# Patient Record
Sex: Female | Born: 1990
Health system: Southern US, Community
[De-identification: ages and names within clinical notes are randomized; demographics above are authoritative.]

## PROBLEM LIST (undated history)

## (undated) DIAGNOSIS — K219 Gastro-esophageal reflux disease without esophagitis: Secondary | ICD-10-CM

## (undated) DIAGNOSIS — J189 Pneumonia, unspecified organism: Secondary | ICD-10-CM

## (undated) HISTORY — PX: WISDOM TOOTH EXTRACTION: SHX21

## (undated) HISTORY — DX: Gastro-esophageal reflux disease without esophagitis: K21.9

## (undated) HISTORY — PX: TONSILLECTOMY: SUR1361

---
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
## 2014-09-30 ENCOUNTER — Other Ambulatory Visit: Payer: Self-pay | Admitting: Family Medicine

## 2017-11-07 DIAGNOSIS — Z136 Encounter for screening for cardiovascular disorders: Secondary | ICD-10-CM | POA: Diagnosis not present

## 2017-11-07 DIAGNOSIS — E282 Polycystic ovarian syndrome: Secondary | ICD-10-CM | POA: Diagnosis not present

## 2017-12-27 ENCOUNTER — Ambulatory Visit: Payer: Self-pay | Admitting: Family Medicine

## 2018-01-17 DIAGNOSIS — R7989 Other specified abnormal findings of blood chemistry: Secondary | ICD-10-CM | POA: Diagnosis not present

## 2018-01-17 DIAGNOSIS — E282 Polycystic ovarian syndrome: Secondary | ICD-10-CM | POA: Diagnosis not present

## 2018-01-17 DIAGNOSIS — N926 Irregular menstruation, unspecified: Secondary | ICD-10-CM | POA: Diagnosis not present

## 2018-01-17 DIAGNOSIS — N912 Amenorrhea, unspecified: Secondary | ICD-10-CM | POA: Diagnosis not present

## 2018-01-17 DIAGNOSIS — D509 Iron deficiency anemia, unspecified: Secondary | ICD-10-CM | POA: Diagnosis not present

## 2018-01-17 DIAGNOSIS — R946 Abnormal results of thyroid function studies: Secondary | ICD-10-CM | POA: Diagnosis not present

## 2018-10-10 DIAGNOSIS — Z01419 Encounter for gynecological examination (general) (routine) without abnormal findings: Secondary | ICD-10-CM | POA: Diagnosis not present

## 2018-10-10 DIAGNOSIS — Z124 Encounter for screening for malignant neoplasm of cervix: Secondary | ICD-10-CM | POA: Diagnosis not present

## 2018-11-07 DIAGNOSIS — R87612 Low grade squamous intraepithelial lesion on cytologic smear of cervix (LGSIL): Secondary | ICD-10-CM | POA: Diagnosis not present

## 2018-11-07 DIAGNOSIS — N72 Inflammatory disease of cervix uteri: Secondary | ICD-10-CM | POA: Diagnosis not present

## 2018-11-07 DIAGNOSIS — N870 Mild cervical dysplasia: Secondary | ICD-10-CM | POA: Diagnosis not present

## 2018-11-13 DIAGNOSIS — Z20828 Contact with and (suspected) exposure to other viral communicable diseases: Secondary | ICD-10-CM | POA: Diagnosis not present

## 2018-11-19 DIAGNOSIS — H11151 Pinguecula, right eye: Secondary | ICD-10-CM | POA: Diagnosis not present

## 2019-01-14 DIAGNOSIS — R7989 Other specified abnormal findings of blood chemistry: Secondary | ICD-10-CM | POA: Diagnosis not present

## 2019-01-14 DIAGNOSIS — R1031 Right lower quadrant pain: Secondary | ICD-10-CM | POA: Diagnosis not present

## 2019-01-14 DIAGNOSIS — F17210 Nicotine dependence, cigarettes, uncomplicated: Secondary | ICD-10-CM | POA: Diagnosis not present

## 2019-01-16 DIAGNOSIS — R102 Pelvic and perineal pain: Secondary | ICD-10-CM | POA: Diagnosis not present

## 2019-01-16 DIAGNOSIS — E282 Polycystic ovarian syndrome: Secondary | ICD-10-CM | POA: Diagnosis not present

## 2019-02-11 ENCOUNTER — Encounter: Payer: BC Managed Care – PPO | Admitting: Obstetrics and Gynecology

## 2019-02-20 ENCOUNTER — Other Ambulatory Visit: Payer: Self-pay

## 2019-02-20 ENCOUNTER — Ambulatory Visit (INDEPENDENT_AMBULATORY_CARE_PROVIDER_SITE_OTHER): Payer: BC Managed Care – PPO | Admitting: Obstetrics and Gynecology

## 2019-02-20 ENCOUNTER — Encounter: Payer: Self-pay | Admitting: Obstetrics and Gynecology

## 2019-02-20 ENCOUNTER — Encounter: Payer: BC Managed Care – PPO | Admitting: Obstetrics and Gynecology

## 2019-02-20 VITALS — BP 134/87 | HR 108 | Ht 67.0 in | Wt 254.0 lb

## 2019-02-20 DIAGNOSIS — R1031 Right lower quadrant pain: Secondary | ICD-10-CM

## 2019-02-20 DIAGNOSIS — N83201 Unspecified ovarian cyst, right side: Secondary | ICD-10-CM | POA: Diagnosis not present

## 2019-02-20 DIAGNOSIS — R102 Pelvic and perineal pain: Secondary | ICD-10-CM

## 2019-02-20 NOTE — Progress Notes (Signed)
Gynecology Pelvic Pain Evaluation   Chief Complaint:  Chief Complaint  Patient presents with  . Ovarian Cyst    Ultrasound showed right side cyst Elzie Rings     History of Present Illness:   Patient is a 29 y.o. G0P0000 who LMP was Patient's last menstrual period was 02/19/2019., presents today for a problem visit.  She complains of pelvic pain.   Her pain is localized to the RLQ area, described as sharp and stabbing, began on January 4th, woke patient from sleep. and its severity is described as moderate. The pain radiates to the  Non-radiating. She has these associated symptoms which include abdominal pain. Patient has these modifiers which include relaxation and lying down that make it better and movement and activity that make it worse.  Context includes: unrelated to menstrual cycle.    Previous evaluation: ultrasound showing at outside gynecologist showing a a 4.8cm (records not available for review) Previous Treatment: NSAIDs and plan for follow up ultrasound.  The patient has not personal or family history of breast, ovarian or colon cancer.  Pap smears are up to date.    Review of Systems: Review of Systems  Constitutional: Negative.   Gastrointestinal: Positive for abdominal pain.  Genitourinary: Negative.     Past Medical History:  Past Medical History:  Diagnosis Date  . GERD (gastroesophageal reflux disease)     Past Surgical History:  Past Surgical History:  Procedure Laterality Date  . NO PAST SURGERIES      Gynecologic History:  Patient's last menstrual period was 02/19/2019.  Obstetric History: G0P0000  Family History:  History reviewed. No pertinent family history.  Social History:  Social History   Socioeconomic History  . Marital status: Single    Spouse name: Not on file  . Number of children: Not on file  . Years of education: Not on file  . Highest education level: Not on file  Occupational History  . Not on file  Tobacco Use  .  Smoking status: Current Every Day Smoker  . Smokeless tobacco: Never Used  Substance and Sexual Activity  . Alcohol use: Yes    Comment: Occ  . Drug use: Never  . Sexual activity: Yes    Birth control/protection: Pill  Other Topics Concern  . Not on file  Social History Narrative  . Not on file   Social Determinants of Health   Financial Resource Strain:   . Difficulty of Paying Living Expenses: Not on file  Food Insecurity:   . Worried About Charity fundraiser in the Last Year: Not on file  . Ran Out of Food in the Last Year: Not on file  Transportation Needs:   . Lack of Transportation (Medical): Not on file  . Lack of Transportation (Non-Medical): Not on file  Physical Activity:   . Days of Exercise per Week: Not on file  . Minutes of Exercise per Session: Not on file  Stress:   . Feeling of Stress : Not on file  Social Connections:   . Frequency of Communication with Friends and Family: Not on file  . Frequency of Social Gatherings with Friends and Family: Not on file  . Attends Religious Services: Not on file  . Active Member of Clubs or Organizations: Not on file  . Attends Archivist Meetings: Not on file  . Marital Status: Not on file  Intimate Partner Violence:   . Fear of Current or Ex-Partner: Not on file  . Emotionally  Abused: Not on file  . Physically Abused: Not on file  . Sexually Abused: Not on file    Allergies:  No Known Allergies  Medications: Prior to Admission medications   Medication Sig Start Date End Date Taking? Authorizing Provider  acyclovir (ZOVIRAX) 400 MG tablet Take 400 mg by mouth daily. 09/11/18  Yes [provider]  metFORMIN (GLUCOPHAGE) 1000 MG tablet Take by mouth.   Yes [provider]  [name] 3-0.02 MG tablet Take 1 tablet by mouth daily. 01/20/19  Yes [provider]  pantoprazole (PROTONIX) 40 MG tablet Take 40 mg by mouth daily.   Yes [provider]    Physical Exam Vitals:  Blood pressure 134/87, pulse (!) 108, height 5\' 7"  (1.702 m), weight 254 lb (115.2 kg), last menstrual period 02/19/2019.  General: NAD HEENT: normocephalic, anicteric Pulmonary: No increased work of breathing Genitourinary:  External: Normal external female genitalia.  Normal urethral meatus, normal Bartholin's and Skene's glands.    Vagina: Normal vaginal mucosa, no evidence of prolapse.    Cervix: Grossly normal in appearance, no bleeding  Uterus: Non-enlarged, mobile, normal contour.  No CMT  Adnexa: ovaries non-enlarged, some fullness appreciated over the right adnexa  Rectal: deferred  Lymphatic: no evidence of inguinal lymphadenopathy Extremities: no edema, erythema, or tenderness Neurologic: Grossly intact Psychiatric: mood appropriate, affect full  Female chaperone present for pelvic portion of the physical exam  Assessment: 29 y.o. G0P0000 with pelvic pain and previously imaged right ovarian cyst.  Problem List Items Addressed This Visit    None    Visit Diagnoses    Pelvic pain    -  Primary   Relevant Orders   US Transvaginal Non-OB   Right ovarian cyst       Relevant Orders   US Transvaginal Non-OB       1)The incidence and implication of adnexal masses and ovarian cysts were discussed with the patient in detail.  Prior imaging if available was reviewed at today's visit..  The vast majority of these lesions will represent benign or physiologic processes and may well resolve on repeat imaging with expectant management.  We discussed that in a premenopausal patient not on ovulation suppression with via a systemic form hormonal contraception the normal function of the ovary during follicular development is the formation of a dominant follicle or cyst(s) every month.  This is an essential part of normal reproductive physiology.  In some cases these cysts can take on larger dimensions, hemorrhage, or undergo torsion making them symptomatic. Torsion is relatively unlikely for  lesions under 5 cm.  Based on initial imaging findings the overall concern for malignancy is deemed low.  We will obtain follow up imagine approximately 6 weeks from the date of the initial imaging study.  Torsion precautions were given.    -  TVUS to evaluate up to date on pap per patient, request for outside records signed today at check out  2) A total of 15 minutes were spent in face-to-face contact with the patient during this encounter with over half of that time devoted to counseling and coordination of care.  3) Return in 5 days (on 02/25/2019) for  TUVS and follow up (release of medical records form prior US and pap).   [name], MD, [facility], [place]

## 2019-02-25 ENCOUNTER — Ambulatory Visit (INDEPENDENT_AMBULATORY_CARE_PROVIDER_SITE_OTHER): Payer: BC Managed Care – PPO | Admitting: Obstetrics and Gynecology

## 2019-02-25 ENCOUNTER — Other Ambulatory Visit: Payer: Self-pay

## 2019-02-25 ENCOUNTER — Other Ambulatory Visit: Payer: Self-pay | Admitting: Obstetrics and Gynecology

## 2019-02-25 ENCOUNTER — Ambulatory Visit (INDEPENDENT_AMBULATORY_CARE_PROVIDER_SITE_OTHER): Payer: BC Managed Care – PPO

## 2019-02-25 ENCOUNTER — Encounter: Payer: Self-pay | Admitting: Obstetrics and Gynecology

## 2019-02-25 VITALS — BP 146/80 | Wt 253.0 lb

## 2019-02-25 DIAGNOSIS — R1900 Intra-abdominal and pelvic swelling, mass and lump, unspecified site: Secondary | ICD-10-CM | POA: Diagnosis not present

## 2019-02-25 DIAGNOSIS — N83201 Unspecified ovarian cyst, right side: Secondary | ICD-10-CM

## 2019-02-25 DIAGNOSIS — R109 Unspecified abdominal pain: Secondary | ICD-10-CM

## 2019-02-25 DIAGNOSIS — R102 Pelvic and perineal pain: Secondary | ICD-10-CM | POA: Diagnosis not present

## 2019-02-25 NOTE — Progress Notes (Signed)
Gynecology Ultrasound Follow Up  Chief Complaint:  Chief Complaint  Patient presents with  . Follow-up    GYN ultrasound     History of Present Illness: Patient is a 29 y.o. female who presents today for ultrasound evaluation of previously imaged right ovarian mass.  Ultrasound demonstrates the following findgins Adnexa: Left ovary normal, the right ovary is not visualized.  There is a midline 11.5 cm complex pelvic mass in the posterior cul de sac that is though to represent the right ovary.  Uterus: Non-enlarged with endometrial stripe homogenous, without focal lesions Additional: no free fluid  Review of Systems: Review of Systems  Constitutional: Negative.   Gastrointestinal: Positive for abdominal pain.  Genitourinary: Negative.     Past Medical History:  Past Medical History:  Diagnosis Date  . GERD (gastroesophageal reflux disease)     Past Surgical History:  Past Surgical History:  Procedure Laterality Date  . NO PAST SURGERIES      Gynecologic History:  Patient's last menstrual period was 02/19/2019.  Family History:  History reviewed. No pertinent family history.  Social History:  Social History   Socioeconomic History  . Marital status: Single    Spouse name: Not on file  . Number of children: Not on file  . Years of education: Not on file  . Highest education level: Not on file  Occupational History  . Not on file  Tobacco Use  . Smoking status: Current Every Day Smoker  . Smokeless tobacco: Never Used  Substance and Sexual Activity  . Alcohol use: Yes    Comment: Occ  . Drug use: Never  . Sexual activity: Yes    Birth control/protection: Pill  Other Topics Concern  . Not on file  Social History Narrative  . Not on file   Social Determinants of Health   Financial Resource Strain:   . Difficulty of Paying Living Expenses: Not on file  Food Insecurity:   . Worried About Charity fundraiser in the Last Year: Not on file  . Ran Out  of Food in the Last Year: Not on file  Transportation Needs:   . Lack of Transportation (Medical): Not on file  . Lack of Transportation (Non-Medical): Not on file  Physical Activity:   . Days of Exercise per Week: Not on file  . Minutes of Exercise per Session: Not on file  Stress:   . Feeling of Stress : Not on file  Social Connections:   . Frequency of Communication with Friends and Family: Not on file  . Frequency of Social Gatherings with Friends and Family: Not on file  . Attends Religious Services: Not on file  . Active Member of Clubs or Organizations: Not on file  . Attends Archivist Meetings: Not on file  . Marital Status: Not on file  Intimate Partner Violence:   . Fear of Current or Ex-Partner: Not on file  . Emotionally Abused: Not on file  . Physically Abused: Not on file  . Sexually Abused: Not on file    Allergies:  No Known Allergies  Medications: Prior to Admission medications   Medication Sig Start Date End Date Taking? Authorizing Provider  acyclovir (ZOVIRAX) 400 MG tablet Take 400 mg by mouth daily. 09/11/18   [provider]  metFORMIN (GLUCOPHAGE) 1000 MG tablet Take by mouth.    [provider]  [name] 3-0.02 MG tablet Take 1 tablet by mouth daily. 01/20/19   [provider]  pantoprazole (  PROTONIX) 40 MG tablet Take 40 mg by mouth daily.    [provider]    Physical Exam Vitals: Blood pressure (!) 146/80, weight 253 lb (114.8 kg), last menstrual period 02/19/2019.  General: NAD HEENT: normocephalic, anicteric Pulmonary: No increased work of breathing Extremities: no edema, erythema, or tenderness Neurologic: Grossly intact, normal gait Psychiatric: mood appropriate, affect full  US PELVIC COMPLETE WITH TRANSVAGINAL  Result Date: 02/25/2019 Patient Name: Stephanie Price DOB: [birth year] MRN: [id number] ULTRASOUND REPORT Location: [facility] Date of Service: 02/25/2019 Indications:Pelvic Pain and right  ovarian cyst Findings: The uterus is axial and measures 6.7 x 4.7 x 3.4 cm. Echo texture is homogenous without evidence of focal masses. The Endometrium measures 8.3 mm. There is a complex mass in the midline pelvis that may be within the right ovary. It measures 11.5 x 8.4 x 10.7 cm. No blood flow is seen within. This may represent a dermoid cyst vs. Other. Left Ovary measures 3.4 x 2.4 x 2.3 cm. It is normal in appearance. Survey of the adnexa demonstrates no adnexal masses. There is no free fluid in the cul de sac. Impression: 1. Normal appearing uterus and left ovary. 2. There is a 11.5 cm complex mass in the midline pelvis that may be from the right ovary. Recommendations: 1.Clinical correlation with the patient's History and Physical Exam. Gweneth Dimitri, RT Patient presents for follow up imaging for 4.8cm right ovarian mass noted on outside imaging.  Records were requested but still not available for review.  Imaging today shows a 11.5 x 8.4 x 10.7cm mass in the posterior cul de sac.  The right ovary is not visualized so it is presumed this mass is arising from the right ovary.  The appearance is complex with acoustic shadowing.  Given patient age dermoid is the highest on the differential.  However, the interval increase in size from initial imaging warrants further work up prior to removal. [name], MD, [place] 02/25/2019, 10:55 AM    Assessment: 29 y.o. G0P0000 No problem-specific Assessment & Plan notes found for this encounter.   Plan: Problem List Items Addressed This Visit    None    Visit Diagnoses    Pelvic mass    -  Primary   Relevant Orders   Ovarian Malignancy Risk-ROMA   MR PELVIS W WO CONTRAST      1)  Pelvic mass - I still not have prior imaging to review.  However, per patient she reports that she was told the right ovary has a 4.8cm cyst.  Given complex appearance and interval increase in size a ROMA was ordered as well  as MRI to further characterize.  Differential includes both benign and malignant ovarian neoplasms.  Given age and appearance highest suspicion is for a Dermoid ovarian cyst.  The mass has limited blood flow on it internal component.  An ovary that has already undergone torsion is also in the differential.  If MRI and ROMA favor a benign process will proceed with laparoscopic resection, if concern for malignancy will arrange joint case with gynecology oncology.    2) A total of 15 minutes were spent in face-to-face contact with the patient during this encounter with over half of that time devoted to counseling and coordination of care.  3) Return if symptoms worsen or fail to improve.   [name], MD, [facility], [facility] 02/25/2019, 10:44 AM

## 2019-02-26 ENCOUNTER — Telehealth: Payer: Self-pay | Admitting: Obstetrics and Gynecology

## 2019-02-26 LAB — POSTMENOPAUSAL INTERP: LOW

## 2019-02-26 LAB — OVARIAN MALIGNANCY RISK-ROMA
Cancer Antigen (CA) 125: 21.6 U/mL (ref 0.0–38.1)
HE4: 41.4 pmol/L (ref 0.0–61.2)
Postmenopausal ROMA: 1.23
Premenopausal ROMA: 0.5

## 2019-02-26 LAB — PREMENOPAUSAL INTERP: LOW

## 2019-02-26 NOTE — Telephone Encounter (Signed)
Spoke with pt and confirmed 2/25 surgery date Informed her of H&P with [facility] on 2/23 @ 8:10 (req. she arrive @ 8:00) Told pt to have Covid testing done at [place] after H&P appt on 2/23.  Confirmed her MRI appt 2/24 @ 4:00 and advised to wear mask.  Pt had questions regarding artifical nails prior to surgery.  Pt asked for recent lab results, informed that nurse would return call. Pt anxious for results.

## 2019-02-27 NOTE — Telephone Encounter (Signed)
Pt calling for blood marker results.  [phone] Pt aware AMS is out of the office today but msg will be forwarded to him.

## 2019-02-28 NOTE — Telephone Encounter (Signed)
Patient returning call, aware of normal results, no questions.

## 2019-02-28 NOTE — Telephone Encounter (Signed)
Tried contacting patient the results are normal, her voicemailbox is not set up

## 2019-03-04 ENCOUNTER — Other Ambulatory Visit: Payer: Self-pay

## 2019-03-04 ENCOUNTER — Encounter
Admission: RE | Admit: 2019-03-04 | Discharge: 2019-03-04 | Disposition: A | Discharge status: Home / Self Care | Payer: BC Managed Care – PPO | Source: Ambulatory Visit | Attending: Obstetrics and Gynecology | Admitting: Obstetrics and Gynecology

## 2019-03-04 NOTE — Patient Instructions (Signed)
Your procedure is scheduled on: 03/07/19 Report to [place]. To find out your arrival time please call [phone] between 1PM - 3PM on 03/06/19.  Remember: Instructions that are not followed completely may result in serious medical risk, up to and including death, or upon the discretion of your surgeon and anesthesiologist your surgery may need to be rescheduled.     _X__ 1. Do not eat food after midnight the night before your procedure.                 No gum chewing or hard candies. You may drink clear liquids up to 2 hours                 before you are scheduled to arrive for your surgery- DO not drink clear                 liquids within 2 hours of the start of your surgery.                 Clear Liquids include:  water, apple juice without pulp, clear carbohydrate                 drink such as Clearfast or Gatorade, Black Coffee or Tea (Do not add                 anything to coffee or tea). Diabetics water only  __X__2.  On the morning of surgery brush your teeth with toothpaste and water, you                 may rinse your mouth with mouthwash if you wish.  Do not swallow any              toothpaste of mouthwash.     _X__ 3.  No Alcohol for 24 hours before or after surgery.   _X__ 4.  Do Not Smoke or use e-cigarettes For 24 Hours Prior to Your Surgery.                 Do not use any chewable tobacco products for at least 6 hours prior to                 surgery.  ____  5.  Bring all medications with you on the day of surgery if instructed.   __X__  6.  Notify your doctor if there is any change in your medical condition      (cold, fever, infections).     Do not wear jewelry, make-up, hairpins, clips or nail polish. Do not wear lotions, powders, or perfumes.  Do not shave 48 hours prior to surgery. Men may shave face and neck. Do not bring valuables to the hospital.    [facility] is not responsible for any belongings or  valuables.  Contacts, dentures/partials or body piercings may not be worn into surgery. Bring a case for your contacts, glasses or hearing aids, a denture cup will be supplied. Leave your suitcase in the car. After surgery it may be brought to your room. For patients admitted to the hospital, discharge time is determined by your treatment team.   Patients discharged the day of surgery will not be allowed to drive home.   Please read over the following fact sheets that you were given:   MRSA Information  __X__ Take these medicines the morning of surgery with A SIP OF WATER:  1. pantoprazole (PROTONIX) 40 MG tablet  2. Stephanie Price 3-0.02 MG tablet  3. acyclovir (ZOVIRAX) 400 MG tablet if needed  4.  5.  6.  ____ Fleet Enema (as directed)   __X__ Use CHG Soap/SAGE wipes as directed  ____ Use inhalers on the day of surgery  ____ Stop metformin/Janumet/Farxiga 2 days prior to surgery    ____ Take 1/2 of usual insulin dose the night before surgery. No insulin the morning          of surgery.   ____ Stop Blood Thinners Coumadin/Plavix/Xarelto/Pleta/Pradaxa/Eliquis/Effient/Aspirin  on   Or contact your Surgeon, Cardiologist or Medical Doctor regarding  ability to stop your blood thinners  __X__ Stop Anti-inflammatories 7 days before surgery such as Advil, Ibuprofen, Motrin,  BC or Goodies Powder, Naprosyn, Naproxen, Aleve, Aspirin    __X__ Stop all herbal supplements, fish oil or vitamin E until after surgery.    ____ Bring C-Pap to the hospital.

## 2019-03-05 ENCOUNTER — Other Ambulatory Visit
Admission: RE | Admit: 2019-03-05 | Discharge: 2019-03-05 | Disposition: A | Discharge status: Home / Self Care | Payer: BC Managed Care – PPO | Source: Ambulatory Visit | Attending: Obstetrics and Gynecology | Admitting: Obstetrics and Gynecology

## 2019-03-05 ENCOUNTER — Encounter: Payer: Self-pay | Admitting: Obstetrics and Gynecology

## 2019-03-05 ENCOUNTER — Ambulatory Visit (INDEPENDENT_AMBULATORY_CARE_PROVIDER_SITE_OTHER): Payer: BC Managed Care – PPO | Admitting: Obstetrics and Gynecology

## 2019-03-05 VITALS — BP 126/80 | HR 73 | Ht 67.0 in | Wt 254.0 lb

## 2019-03-05 DIAGNOSIS — R1900 Intra-abdominal and pelvic swelling, mass and lump, unspecified site: Secondary | ICD-10-CM

## 2019-03-05 DIAGNOSIS — Z01812 Encounter for preprocedural laboratory examination: Secondary | ICD-10-CM | POA: Insufficient documentation

## 2019-03-05 DIAGNOSIS — Z01818 Encounter for other preprocedural examination: Secondary | ICD-10-CM | POA: Diagnosis not present

## 2019-03-05 DIAGNOSIS — U071 COVID-19: Secondary | ICD-10-CM | POA: Insufficient documentation

## 2019-03-05 DIAGNOSIS — R1909 Other intra-abdominal and pelvic swelling, mass and lump: Secondary | ICD-10-CM

## 2019-03-05 LAB — SARS CORONAVIRUS 2 (TAT 6-24 HRS): SARS Coronavirus 2: POSITIVE — AB

## 2019-03-05 NOTE — Progress Notes (Signed)
Obstetrics & Gynecology Surgery H&P    Chief Complaint: Scheduled Surgery   History of Present Illness: Patient is a 29 y.o. G0P0000 presenting for scheduled laparoscopic removal of pelvic mass, for the treatment or further evaluation of pelvic pain with 10cm pelvic mass on ultrasound.   Prior Treatments prior to proceeding with surgery include: imaging  Preoperative Pap: UTD per patient at [facility] Preoperative Endometrial biopsy: N/A Preoperative Ultrasound: 02/25/2019 10cm central pelvic mass, complex in appearance.  Negative ROMA tumor markers 02/25/2019.  MRI 03/06/2019 pending   Review of Systems:10 point review of systems  Past Medical History:  Past Medical History:  Diagnosis Date  . GERD (gastroesophageal reflux disease)     Past Surgical History:  Past Surgical History:  Procedure Laterality Date  . TONSILLECTOMY      Family History:  History reviewed. No pertinent family history.  Social History:  Social History   Socioeconomic History  . Marital status: Single    Spouse name: Not on file  . Number of children: Not on file  . Years of education: Not on file  . Highest education level: Not on file  Occupational History  . Not on file  Tobacco Use  . Smoking status: Current Every Day Smoker    Packs/day: 0.50    Types: Cigarettes  . Smokeless tobacco: Never Used  Substance and Sexual Activity  . Alcohol use: Yes    Comment: Occ  . Drug use: Never  . Sexual activity: Yes    Birth control/protection: Pill  Other Topics Concern  . Not on file  Social History Narrative  . Not on file   Social Determinants of Health   Financial Resource Strain:   . Difficulty of Paying Living Expenses: Not on file  Food Insecurity:   . Worried About Charity fundraiser in the Last Year: Not on file  . Ran Out of Food in the Last Year: Not on file  Transportation Needs:   . Lack of Transportation (Medical): Not on file  . Lack of Transportation  (Non-Medical): Not on file  Physical Activity:   . Days of Exercise per Week: Not on file  . Minutes of Exercise per Session: Not on file  Stress:   . Feeling of Stress : Not on file  Social Connections:   . Frequency of Communication with Friends and Family: Not on file  . Frequency of Social Gatherings with Friends and Family: Not on file  . Attends Religious Services: Not on file  . Active Member of Clubs or Organizations: Not on file  . Attends Archivist Meetings: Not on file  . Marital Status: Not on file  Intimate Partner Violence:   . Fear of Current or Ex-Partner: Not on file  . Emotionally Abused: Not on file  . Physically Abused: Not on file  . Sexually Abused: Not on file    Allergies:  No Known Allergies  Medications: Prior to Admission medications   Medication Sig Start Date End Date Taking? Authorizing Provider  acetaminophen (TYLENOL) 325 MG tablet Take 650 mg by mouth every 6 (six) hours as needed for moderate pain or headache.   Yes [provider]  acyclovir (ZOVIRAX) 400 MG tablet Take 400 mg by mouth daily as needed (outbreaks).  09/11/18  Yes [provider]  [name] 3-0.02 MG tablet Take 1 tablet by mouth daily. 01/20/19  Yes [provider]  pantoprazole (PROTONIX) 40 MG tablet Take 40 mg by mouth daily as  needed (GERD).    Yes [provider]    Physical Exam Vitals: Blood pressure 126/80, pulse 73, height 5\' 7"  (1.702 m), weight 254 lb (115.2 kg), last menstrual period 02/19/2019. General: NAD HEENT: normocephalic, anicteric Pulmonary: No increased work of breathing, CTAB Cardiovascular: RRR, distal pulses 2+ Abdomen: soft, non-tender, non-distended Extremities: no edema, erythema, or tenderness Neurologic: Grossly intact Psychiatric: mood appropriate, affect full  Imaging US PELVIC COMPLETE WITH TRANSVAGINAL  Result Date: 02/25/2019 Patient Name: ALLYANA FORSHAW DOB: 1990/08/23 MRN: [id number] ULTRASOUND  REPORT Location: [facility] Date of Service: 02/25/2019 Indications:Pelvic Pain and right ovarian cyst Findings: The uterus is axial and measures 6.7 x 4.7 x 3.4 cm. Echo texture is homogenous without evidence of focal masses. The Endometrium measures 8.3 mm. There is a complex mass in the midline pelvis that may be within the right ovary. It measures 11.5 x 8.4 x 10.7 cm. No blood flow is seen within. This may represent a dermoid cyst vs. Other. Left Ovary measures 3.4 x 2.4 x 2.3 cm. It is normal in appearance. Survey of the adnexa demonstrates no adnexal masses. There is no free fluid in the cul de sac. Impression: 1. Normal appearing uterus and left ovary. 2. There is a 11.5 cm complex mass in the midline pelvis that may be from the right ovary. Recommendations: 1.Clinical correlation with the patient's History and Physical Exam. Gweneth Dimitri, RT Patient presents for follow up imaging for 4.8cm right ovarian mass noted on outside imaging.  Records were requested but still not available for review.  Imaging today shows a 11.5 x 8.4 x 10.7cm mass in the posterior cul de sac.  The right ovary is not visualized so it is presumed this mass is arising from the right ovary.  The appearance is complex with acoustic shadowing.  Given patient age dermoid is the highest on the differential.  However, the interval increase in size from initial imaging warrants further work up prior to removal. [name], MD, [place] 02/25/2019, 10:55 AM   Assessment: 29 y.o. G0P0000 presenting for scheduled laparoscopic removal of pelvic mass  Plan: 1) I have had a careful discussion with this patient about all the options available and the risk/benefits of each. I have fully informed this patient that a laparoscopy may subject her to a variety of discomforts and risks: She understands that most patients have surgery with little difficulty, but problems can happen ranging from  minor to fatal. These include nausea, vomiting, pain, bleeding, infection, poor healing, hernia, or formation of adhesions. Unexpected reactions may occur from any drug or anesthetic given. Unintended injury may occur to other pelvic or abdominal structures such as Fallopian tubes, ovaries, bladder, ureter (tube from kidney to bladder), or bowel. Nerves going from the pelvis to the legs may be injured. Any such injury may require immediate or later additional surgery to correct the problem. Excessive blood loss requiring transfusion is very unlikely but possible. Dangerous blood clots may form in the legs or lungs. Physical and sexual activity will be restricted in varying degrees for an indeterminate period of time but most often 2-4 weeks. She understands that the plan is to do this laparoscopically, however, there is a chance that this will need to be performed via a larger incision. Finally, she understands that it is impossible to list every possible undesirable effect and that the condition for which surgery is done is not always cured or significantly improved, and in rare  cases may be even worsen. Ample time was given to answer all questions. - depending on origin of mass which is presumed to be right ovary, and depending on whether there is any normal ovarian tissue able to be identified the procedure may include oophorectomy, or salpingectomy  2) Routine postoperative instructions were reviewed with the patient and her family in detail today including the expected length of recovery and likely postoperative course.  The patient concurred with the proposed plan, giving informed written consent for the surgery today.  Patient instructed on the importance of being NPO after midnight prior to her procedure.  If warranted preoperative prophylactic antibiotics and SCDs ordered on call to the OR to meet SCIP guidelines and adhere to recommendation laid forth in [place] Number 104 May 2009   "Antibiotic Prophylaxis for Gynecologic Procedures".     [name], MD, [facility], [facility] 03/05/2019, 8:56 AM

## 2019-03-05 NOTE — Pre-Procedure Instructions (Addendum)
Secure chat with Dr Georgianne Fick:  Me: Pt had CBC and BMP done 01/14/19 at [facility] (care everywhere). Do you still want CBC. TY   Staebler: no we can use that as baseline      Copied from [facility] 01/14/19   CBC AND DIFFERENTIAL - Abnormal  Result Value  WBC 12.7 (*)  RBC 5.16 (*)  HGB 15.1 (*)  HCT 45.1  MCV 87  MCH 29.3  MCHC 33.5  Plt Ct 283  RDW SD 38.4  MPV 10.8  NRBC% 0.0  NRBC 0.000  NEUTROPHIL % 65.9  LYMPHOCYTE % 24.2 (*)  MONOCYTE % 6.4  Eosinophil % 2.3  BASOPHIL % 0.7  IG% 0.500 (*)  ABSOLUTE NEUTROPHIL COUNT 8.34 (*)  ABSOLUTE LYMPHOCYTE COUNT 3.1  MONO ABSOLUTE 0.8  EOS ABSOLUTE 0.3  BASO ABSOLUTE 0.1  IG ABSOLUTE 0.060 (*)  COMPREHENSIVE METABOLIC PANEL - Abnormal  Na 138  Potassium 4.0  Cl 104  CO2 23  Glucose 105 (*)  BUN 11  Creatinine 0.87  Ca 9.2  ALK PHOS 66  T Bili 0.25  Total Protein 7.1  Alb 4.0  GLOBULIN 3.1  ALBUMIN/GLOBULIN RATIO 1.3  BUN/CREAT RATIO 12.6  ALT 32  AST 20  GFR AFRICAN AMERICAN 104  Comment: African-American:  Normal GFR (glomerular filtration rate) > 60 mL/min/1.73 meters squared. < 60 may include impaired kidney function based on creatinine, age, legal sex, and race normalized to accepted average body surface area  GFR Non African American 90  Comment: Non African American:  Normal GFR (glomerular filtration rate) > 60 mL/min/1.73 meters squared. < 60 may include impaired kidney function based on creatinine, age, legal sex, and race normalized to accepted average body surface area.  AGAP 11  UA NEGATIVE POPULATION, POS SYMPTOMS - Abnormal  Urine Color Yellow  Urine Appearance Cloudy (*)  Urine Specific Gravity 1.028  Urine pH 5.5  Urine Protein - Dipstick Negative  Urine Glucose Negative  Urine Ketones Negative  Urine Bilirubin Negative  Urine Blood 0.1 (*)  Urine Nitrite Negative  Urine Urobilinogen <2  Urine Leukocyte Esterase Negative  Urine Squamous Epithelial Cells 10-20 (*)  Urine WBC  3-5 (*)  Urine RBC 0-2  Urine Bacteria 2+ (*)  Urine Mucous 2+ (*)  Urine Hyaline Casts 0 -1  Narrative:  Does not meet criteria for reflex to Urine Culture.  HCG, URINE, QUALITATIVE - Normal  U BETA HCG QUAL Negative  LIGHT BLUE TOP  GOLD SST

## 2019-03-06 ENCOUNTER — Telehealth: Payer: Self-pay | Admitting: Obstetrics and Gynecology

## 2019-03-06 ENCOUNTER — Ambulatory Visit: Payer: BC Managed Care – PPO

## 2019-03-06 ENCOUNTER — Telehealth: Payer: Self-pay | Admitting: Nurse Practitioner

## 2019-03-06 NOTE — Telephone Encounter (Signed)
Patient is calling back to speak with Dr. Georgianne Fick. Patient has additional questions she would like to speak to him about. Please advise

## 2019-03-06 NOTE — Telephone Encounter (Signed)
No stay overnight and the MRI will have/has been moved given the fact that they can't have the MRI down for any length of time following a scan on COVID positive patient

## 2019-03-06 NOTE — Telephone Encounter (Signed)
Called pt to adv that her DOS has been changed to 3/11 as a result of POS Covid test result  Adv that MRI was also changed to 3/8 @ 3:00, pt to arrive at 2:30  Per Diggins @ pre-admit, a 2nd phone interview and 2nd pre-admit Covid test are not needed.

## 2019-03-06 NOTE — Telephone Encounter (Signed)
I spoke to the patient, she states her COVID test came back +. You had told her things depend on the MRI. She is wanting to speak to you on how emergent is the MRI. What are odds of her staying overnight? She lives in [place] and needs to make plans. She has no S&S of COVID. Also, She doesn't think she can wait 3 more weeks. She is really adamant about speaking to you.

## 2019-03-06 NOTE — Telephone Encounter (Signed)
Called to discuss with Stephanie Price about Covid symptoms and the use of bamlanivimab, a monoclonal antibody infusion for those with mild to moderate Covid symptoms and at a high risk of hospitalization.    BMI is >35 however on chart review patient does not qualify for infusion therapy as she has asymptomatic infection per chart review and notes from 03/06/19 ([place], [place]) .   Unable to reach patient.   [name], AGPCNP-BC Pager: [phone] Amion: Bjorn Pippin

## 2019-03-18 ENCOUNTER — Ambulatory Visit
Admission: RE | Admit: 2019-03-18 | Discharge: 2019-03-18 | Disposition: A | Discharge status: Home / Self Care | Payer: BC Managed Care – PPO | Source: Ambulatory Visit | Attending: Obstetrics and Gynecology | Admitting: Obstetrics and Gynecology

## 2019-03-18 ENCOUNTER — Other Ambulatory Visit: Payer: Self-pay

## 2019-03-18 DIAGNOSIS — D270 Benign neoplasm of right ovary: Secondary | ICD-10-CM | POA: Diagnosis not present

## 2019-03-18 DIAGNOSIS — R1900 Intra-abdominal and pelvic swelling, mass and lump, unspecified site: Secondary | ICD-10-CM | POA: Insufficient documentation

## 2019-03-18 MED ORDER — GADOBUTROL 1 MMOL/ML IV SOLN
10.0000 mL | Freq: Once | INTRAVENOUS | Status: AC | PRN
  Administered 2019-03-18: 10 mL via INTRAVENOUS

## 2019-03-21 ENCOUNTER — Other Ambulatory Visit: Payer: Self-pay

## 2019-03-21 ENCOUNTER — Ambulatory Visit: Payer: BC Managed Care – PPO | Admitting: Anesthesiology

## 2019-03-21 ENCOUNTER — Encounter: Payer: Self-pay | Admitting: Obstetrics and Gynecology

## 2019-03-21 ENCOUNTER — Ambulatory Visit
Admission: RE | Admit: 2019-03-21 | Discharge: 2019-03-21 | Disposition: A | Discharge status: Home / Self Care | Payer: BC Managed Care – PPO | Source: Ambulatory Visit | Attending: Obstetrics and Gynecology | Admitting: Obstetrics and Gynecology

## 2019-03-21 ENCOUNTER — Encounter
Admission: RE | Disposition: A | Discharge status: Home / Self Care | Payer: Self-pay | Source: Ambulatory Visit | Attending: Obstetrics and Gynecology

## 2019-03-21 DIAGNOSIS — K219 Gastro-esophageal reflux disease without esophagitis: Secondary | ICD-10-CM | POA: Insufficient documentation

## 2019-03-21 DIAGNOSIS — R1900 Intra-abdominal and pelvic swelling, mass and lump, unspecified site: Secondary | ICD-10-CM | POA: Diagnosis not present

## 2019-03-21 DIAGNOSIS — N83291 Other ovarian cyst, right side: Secondary | ICD-10-CM

## 2019-03-21 DIAGNOSIS — F17210 Nicotine dependence, cigarettes, uncomplicated: Secondary | ICD-10-CM | POA: Diagnosis not present

## 2019-03-21 DIAGNOSIS — D270 Benign neoplasm of right ovary: Secondary | ICD-10-CM | POA: Insufficient documentation

## 2019-03-21 DIAGNOSIS — Z79899 Other long term (current) drug therapy: Secondary | ICD-10-CM | POA: Diagnosis not present

## 2019-03-21 DIAGNOSIS — Z9889 Other specified postprocedural states: Secondary | ICD-10-CM

## 2019-03-21 DIAGNOSIS — N83511 Torsion of right ovary and ovarian pedicle: Secondary | ICD-10-CM

## 2019-03-21 HISTORY — PX: LAPAROSCOPIC OVARIAN CYSTECTOMY: SHX6248

## 2019-03-21 LAB — POCT PREGNANCY, URINE: Preg Test, Ur: NEGATIVE

## 2019-03-21 LAB — TYPE AND SCREEN
ABO/RH(D): O POS
Antibody Screen: NEGATIVE

## 2019-03-21 LAB — ABO/RH: ABO/RH(D): O POS

## 2019-03-21 SURGERY — EXCISION, CYST, OVARY, LAPAROSCOPIC
Anesthesia: General

## 2019-03-21 MED ORDER — BUPIVACAINE HCL 0.5 % IJ SOLN
INTRAMUSCULAR | Status: DC | PRN
  Administered 2019-03-21: 15 mL

## 2019-03-21 MED ORDER — ACETAMINOPHEN NICU IV SYRINGE 10 MG/ML
INTRAVENOUS | Status: AC
  Filled 2019-03-21: qty 1

## 2019-03-21 MED ORDER — OXYCODONE HCL 5 MG PO TABS
5.0000 mg | ORAL_TABLET | Freq: Once | ORAL | Status: AC
  Administered 2019-03-21: 5 mg via ORAL

## 2019-03-21 MED ORDER — FAMOTIDINE 20 MG PO TABS: 20.0000 mg | ORAL_TABLET | Freq: Once | ORAL | Status: DC

## 2019-03-21 MED ORDER — SUGAMMADEX SODIUM 200 MG/2ML IV SOLN
INTRAVENOUS | Status: DC | PRN
  Administered 2019-03-21: 200 mg via INTRAVENOUS

## 2019-03-21 MED ORDER — SUCCINYLCHOLINE CHLORIDE 20 MG/ML IJ SOLN
INTRAMUSCULAR | Status: DC | PRN
  Administered 2019-03-21: 100 mg via INTRAVENOUS

## 2019-03-21 MED ORDER — PROPOFOL 500 MG/50ML IV EMUL
INTRAVENOUS | Status: AC
  Filled 2019-03-21: qty 50

## 2019-03-21 MED ORDER — OXYCODONE HCL 5 MG PO TABS
ORAL_TABLET | ORAL | Status: AC
  Filled 2019-03-21: qty 1

## 2019-03-21 MED ORDER — ROCURONIUM BROMIDE 100 MG/10ML IV SOLN
INTRAVENOUS | Status: DC | PRN
  Administered 2019-03-21: 10 mg via INTRAVENOUS
  Administered 2019-03-21: 20 mg via INTRAVENOUS

## 2019-03-21 MED ORDER — DEXMEDETOMIDINE HCL 200 MCG/2ML IV SOLN
INTRAVENOUS | Status: DC | PRN
  Administered 2019-03-21 (×2): 16 ug via INTRAVENOUS

## 2019-03-21 MED ORDER — BUPIVACAINE HCL (PF) 0.5 % IJ SOLN
INTRAMUSCULAR | Status: AC
  Filled 2019-03-21: qty 30

## 2019-03-21 MED ORDER — LACTATED RINGERS IV SOLN: INTRAVENOUS | Status: DC

## 2019-03-21 MED ORDER — ONDANSETRON HCL 4 MG/2ML IJ SOLN
INTRAMUSCULAR | Status: DC | PRN
  Administered 2019-03-21: 4 mg via INTRAVENOUS

## 2019-03-21 MED ORDER — PROPOFOL 10 MG/ML IV BOLUS
INTRAVENOUS | Status: DC | PRN
  Administered 2019-03-21: 160 mg via INTRAVENOUS

## 2019-03-21 MED ORDER — MIDAZOLAM HCL 2 MG/2ML IJ SOLN
INTRAMUSCULAR | Status: AC
  Filled 2019-03-21: qty 2

## 2019-03-21 MED ORDER — FENTANYL CITRATE (PF) 250 MCG/5ML IJ SOLN
INTRAMUSCULAR | Status: AC
  Filled 2019-03-21: qty 5

## 2019-03-21 MED ORDER — MIDAZOLAM HCL 2 MG/2ML IJ SOLN
INTRAMUSCULAR | Status: DC | PRN
  Administered 2019-03-21: 2 mg via INTRAVENOUS

## 2019-03-21 MED ORDER — LIDOCAINE HCL (CARDIAC) PF 100 MG/5ML IV SOSY
PREFILLED_SYRINGE | INTRAVENOUS | Status: DC | PRN
  Administered 2019-03-21: 100 mg via INTRAVENOUS

## 2019-03-21 MED ORDER — FENTANYL CITRATE (PF) 100 MCG/2ML IJ SOLN
INTRAMUSCULAR | Status: AC
  Administered 2019-03-21: 25 ug via INTRAVENOUS
  Filled 2019-03-21: qty 2

## 2019-03-21 MED ORDER — OXYCODONE-ACETAMINOPHEN 5-325 MG PO TABS: 1.0000 | ORAL_TABLET | ORAL | 0 refills | Status: DC | PRN

## 2019-03-21 MED ORDER — LACTATED RINGERS IV SOLN: INTRAVENOUS | Status: DC | PRN

## 2019-03-21 MED ORDER — FAMOTIDINE 20 MG PO TABS
ORAL_TABLET | ORAL | Status: AC
  Filled 2019-03-21: qty 1

## 2019-03-21 MED ORDER — ACETAMINOPHEN 10 MG/ML IV SOLN
INTRAVENOUS | Status: DC | PRN
  Administered 2019-03-21: 1000 mg via INTRAVENOUS

## 2019-03-21 MED ORDER — DEXAMETHASONE SODIUM PHOSPHATE 10 MG/ML IJ SOLN
INTRAMUSCULAR | Status: DC | PRN
  Administered 2019-03-21: 10 mg via INTRAVENOUS

## 2019-03-21 MED ORDER — SEVOFLURANE IN SOLN
RESPIRATORY_TRACT | Status: AC
  Filled 2019-03-21: qty 250

## 2019-03-21 MED ORDER — FENTANYL CITRATE (PF) 100 MCG/2ML IJ SOLN
INTRAMUSCULAR | Status: DC | PRN
  Administered 2019-03-21 (×5): 50 ug via INTRAVENOUS

## 2019-03-21 MED ORDER — IBUPROFEN 600 MG PO TABS: 600.0000 mg | ORAL_TABLET | Freq: Four times a day (QID) | ORAL | 3 refills | Status: DC | PRN

## 2019-03-21 MED ORDER — FENTANYL CITRATE (PF) 100 MCG/2ML IJ SOLN
25.0000 ug | INTRAMUSCULAR | Status: DC | PRN
  Administered 2019-03-21: 25 ug via INTRAVENOUS

## 2019-03-21 MED ORDER — ONDANSETRON HCL 4 MG/2ML IJ SOLN: 4.0000 mg | Freq: Once | INTRAMUSCULAR | Status: DC | PRN

## 2019-03-21 SURGICAL SUPPLY — 40 items
ANCHOR TIS RET SYS 1550ML (BAG) ×3 IMPLANT
ANCHOR TIS RET SYS 235ML (MISCELLANEOUS) IMPLANT
BAG URINE DRAIN 2000ML AR STRL (UROLOGICAL SUPPLIES) ×3 IMPLANT
BLADE SURG SZ11 CARB STEEL (BLADE) ×3 IMPLANT
CANISTER SUCT 1200ML W/VALVE (MISCELLANEOUS) ×3 IMPLANT
CATH FOLEY 2WAY  5CC 16FR (CATHETERS) ×2
CATH URTH 16FR FL 2W BLN LF (CATHETERS) ×1 IMPLANT
CHLORAPREP W/TINT 26 (MISCELLANEOUS) ×3 IMPLANT
COVER WAND RF STERILE (DRAPES) ×3 IMPLANT
DERMABOND ADVANCED (GAUZE/BANDAGES/DRESSINGS) ×2
DERMABOND ADVANCED .7 DNX12 (GAUZE/BANDAGES/DRESSINGS) ×1 IMPLANT
GLOVE BIO SURGEON STRL SZ7 (GLOVE) ×3 IMPLANT
GLOVE INDICATOR 7.5 STRL GRN (GLOVE) ×3 IMPLANT
GOWN STRL REUS W/ TWL LRG LVL3 (GOWN DISPOSABLE) ×4 IMPLANT
GOWN STRL REUS W/ TWL XL LVL3 (GOWN DISPOSABLE) ×1 IMPLANT
GOWN STRL REUS W/TWL LRG LVL3 (GOWN DISPOSABLE) ×8
GOWN STRL REUS W/TWL XL LVL3 (GOWN DISPOSABLE) ×2
GRASPER SUT TROCAR 14GX15 (MISCELLANEOUS) IMPLANT
IRRIGATION STRYKERFLOW (MISCELLANEOUS) IMPLANT
IRRIGATOR STRYKERFLOW (MISCELLANEOUS)
IV LACTATED RINGERS 1000ML (IV SOLUTION) ×3 IMPLANT
KIT PINK PAD W/HEAD ARE REST (MISCELLANEOUS) ×3
KIT PINK PAD W/HEAD ARM REST (MISCELLANEOUS) ×1 IMPLANT
KIT TURNOVER CYSTO (KITS) ×3 IMPLANT
LABEL OR SOLS (LABEL) IMPLANT
LIGASURE VESSEL 5MM BLUNT TIP (ELECTROSURGICAL) ×3 IMPLANT
NS IRRIG 500ML POUR BTL (IV SOLUTION) ×3 IMPLANT
PACK GYN LAPAROSCOPIC (MISCELLANEOUS) ×3 IMPLANT
PAD OB MATERNITY 4.3X12.25 (PERSONAL CARE ITEMS) ×3 IMPLANT
PAD PREP 24X41 OB/GYN DISP (PERSONAL CARE ITEMS) ×3 IMPLANT
SCISSORS METZENBAUM CVD 33 (INSTRUMENTS) IMPLANT
SET TUBE SMOKE EVAC HIGH FLOW (TUBING) ×3 IMPLANT
SHEARS HARMONIC ACE PLUS 36CM (ENDOMECHANICALS) IMPLANT
SLEEVE ENDOPATH XCEL 5M (ENDOMECHANICALS) ×3 IMPLANT
SUT MNCRL AB 4-0 PS2 18 (SUTURE) ×3 IMPLANT
SUT VIC AB 2-0 UR6 27 (SUTURE) ×3 IMPLANT
SUT VICRYL 0 AB UR-6 (SUTURE) ×6 IMPLANT
TROCAR BLADELESS 15MM (ENDOMECHANICALS) ×3 IMPLANT
TROCAR ENDO BLADELESS 11MM (ENDOMECHANICALS) ×3 IMPLANT
TROCAR XCEL NON-BLD 5MMX100MML (ENDOMECHANICALS) ×3 IMPLANT

## 2019-03-21 NOTE — Transfer of Care (Signed)
2Immediate Anesthesia Transfer of Care Note  Patient: Stephanie Price  Procedure(s) Performed: LAPAROSCOPIC REMOVAL OF PELVIC MASS (N/A )  Patient Location: PACU  Anesthesia Type:General  Level of Consciousness: sedated  Airway & Oxygen Therapy: Patient Spontanous Breathing and Patient connected to face mask oxygen  Post-op Assessment: Report given to RN and Post -op Vital signs reviewed and stable  Post vital signs: Reviewed and stable  Last Vitals:  Vitals Value Taken Time  BP 117/69 03/21/19 1225  Temp 37.2 C 03/21/19 1225  Pulse 64 03/21/19 1232  Resp 12 03/21/19 1232  SpO2 96 % 03/21/19 1232  Vitals shown include unvalidated device data.  Last Pain:  Vitals:   03/21/19 1225  TempSrc:   PainSc: 6          Complications: No apparent anesthesia complications

## 2019-03-21 NOTE — H&P (Signed)
Date of Initial H&P:03/05/2019  History reviewed, patient examined, no change in status, stable for surgery.

## 2019-03-21 NOTE — Discharge Instructions (Signed)

## 2019-03-21 NOTE — Op Note (Signed)
Preoperative Diagnosis: 1) 29 y.o. with right ovarian dermoid  Postoperative Diagnosis: 1) 29 y.o. with right ovarian dermoid 2) Partial torsion of right tube and ovary  Operation Performed: Laparoscopic right oophorectomy  Indication: 29 y.o. G0P0000  with right ovarian dermoid  Surgeon: [name], MD  Anesthesia: General  Preoperative Antibiotics: none  Estimated Blood Loss: 10 mL  IV Fluids: 1L Crystaloid  Urine Output:: 359mL  Drains or Tubes: none  Implants: none  Specimens Removed: right ovary  Complications: none  Intraoperative Findings: Normal tubes, left ovaries, and uterus.  The right ovary was grossly enlarged with partial torsion of the utero-ovarian pedicle and IP.  Patient Condition: stable  Procedure in Detail:  Patient was taken to the operating room where she was administered general anesthesia.  She was positioned in the dorsal lithotomy position utilizing Allen stirups, prepped and draped in the usual sterile fashion.  Prior to proceeding with procedure a time out was performed.  Attention was turned to the patient's pelvis.  A red rubber catheter was used to empty the patient's bladder.  An operative speculum was placed to allow visualization of the cervix.  The anterior lip of the cervix was grasped with a single tooth tenaculum, and a Hulka tenaculum was placed to allow manipulation of the uterus.  The operative speculum and single tooth tenaculum were then removed.  Attention was turned to the patient's abdomen.  The umbilicus was infiltrated with 1% Sensorcaine, before making a stab incision using an 11 blade scalpel.  A 76mm Excel trocar was then used to gain direct entry into the peritoneal cavity utilizing the camera to visualize progress of the trocar during placement.  Once peritoneal entry had been achieved, insufflation was started and pneumoperitoneum established at a pressure of 12mmHg. Two additional 89mm excel trocars were placed under  direct visualiztion in the the right and left lower quadrant with the umbilical trocar stepped up to an 59mm Excel trocar.  General inspection of the abdomen revealed the above noted findings.   The right ovary separate of the fallopian tube using a 73mm Ligasure.  This allowed enough mobility to elevated the ovary out of the pelvis and on twist the pedicle sufficiently to allow visualization.  The IP and utero-ovarian pedicles were ligated and transected using the 77mm Ligasure.  It was apparent that the specimen was to large to fit through a standard retrieval bag so the 25mm trocar site was stepped up to a 9mm port and a large retrieval bag was introduced into the abdomen.  The specimen was retrieved and then ruptured in the bad, but still required morcellation for removal.  Fascial incision was extended using mayo scissors to facilitate removal.    The 13mm trocar was replaced after removal of the specimen.  The pelvis was irrigated, all pedicles inspected and noted to be hemostatic.  The right ureter was visualized well away from the site of dissection. Pneumoperitoneum was evacuated.  The trocars were removed.  The 58mm trocar site was closed using a running 0 Vicryl on a UR-6 for the fascia followed by a 4-0 Monocryl in a subcuticular fashion.  All trocar sites were then dressed with surgical skin glue.  The Hulka tenaculum was removed.  Sponge needle and instrument counts were correct time two.  The patient tolerated the procedure well and was taken to the recovery room in stable condition.

## 2019-03-21 NOTE — Anesthesia Postprocedure Evaluation (Signed)
Anesthesia Post Note  Patient: Stephanie Price  Procedure(s) Performed: LAPAROSCOPIC REMOVAL OF PELVIC MASS (N/A )  Patient location during evaluation: PACU Anesthesia Type: General Level of consciousness: awake and alert Pain management: pain level controlled Vital Signs Assessment: post-procedure vital signs reviewed and stable Respiratory status: spontaneous breathing, nonlabored ventilation, respiratory function stable and patient connected to nasal cannula oxygen Cardiovascular status: blood pressure returned to baseline and stable Postop Assessment: no apparent nausea or vomiting Anesthetic complications: no     Last Vitals:  Vitals:   03/21/19 1419 03/21/19 1451  BP: 103/68 106/67  Pulse: 62 65  Resp: 16 16  Temp: 36.6 C   SpO2: 99% 99%    Last Pain:  Vitals:   03/21/19 1451  TempSrc:   PainSc: 1                  Martha Clan

## 2019-03-21 NOTE — Anesthesia Preprocedure Evaluation (Signed)
Anesthesia Evaluation  Patient identified by MRN, date of birth, ID band Patient awake    Reviewed: Allergy & Precautions, NPO status , Patient's Chart, lab work & pertinent test results  History of Anesthesia Complications Negative for: history of anesthetic complications  Airway Mallampati: II       Dental   Pulmonary neg sleep apnea, neg COPD, Current Smoker and Patient abstained from smoking.,           Cardiovascular (-) hypertension(-) Past MI and (-) CHF (-) dysrhythmias (-) Valvular Problems/Murmurs     Neuro/Psych neg Seizures    GI/Hepatic Neg liver ROS, GERD  Medicated and Controlled,  Endo/Other  neg diabetes  Renal/GU negative Renal ROS     Musculoskeletal   Abdominal   Peds  Hematology   Anesthesia Other Findings   Reproductive/Obstetrics                             Anesthesia Physical Anesthesia Plan  ASA: II  Anesthesia Plan: General   Post-op Pain Management:    Induction: Intravenous  PONV Risk Score and Plan: 2 and Ondansetron and Dexamethasone  Airway Management Planned: Oral ETT  Additional Equipment:   Intra-op Plan:   Post-operative Plan:   Informed Consent: I have reviewed the patients History and Physical, chart, labs and discussed the procedure including the risks, benefits and alternatives for the proposed anesthesia with the patient or authorized representative who has indicated his/her understanding and acceptance.       Plan Discussed with:   Anesthesia Plan Comments:         Anesthesia Quick Evaluation

## 2019-03-21 NOTE — Anesthesia Procedure Notes (Signed)
Procedure Name: Intubation Performed by: Justus Memory, CRNA Pre-anesthesia Checklist: Patient identified, Patient being monitored, Timeout performed, Emergency Drugs available and Suction available Patient Re-evaluated:Patient Re-evaluated prior to induction Oxygen Delivery Method: Circle system utilized Preoxygenation: Pre-oxygenation with 100% oxygen Induction Type: IV induction Ventilation: Mask ventilation without difficulty Laryngoscope Size: Mac, 3 and McGraph Grade View: Grade I Tube type: Oral Tube size: 7.0 mm Number of attempts: 1 Airway Equipment and Method: Stylet and Video-laryngoscopy Placement Confirmation: ETT inserted through vocal cords under direct vision,  positive ETCO2 and breath sounds checked- equal and bilateral Secured at: 21 cm Tube secured with: Tape Dental Injury: Teeth and Oropharynx as per pre-operative assessment

## 2019-03-22 DIAGNOSIS — Z0289 Encounter for other administrative examinations: Secondary | ICD-10-CM

## 2019-03-25 LAB — SURGICAL PATHOLOGY

## 2019-03-26 ENCOUNTER — Ambulatory Visit (INDEPENDENT_AMBULATORY_CARE_PROVIDER_SITE_OTHER): Payer: BC Managed Care – PPO | Admitting: Obstetrics and Gynecology

## 2019-03-26 ENCOUNTER — Encounter: Payer: Self-pay | Admitting: Obstetrics and Gynecology

## 2019-03-26 ENCOUNTER — Other Ambulatory Visit: Payer: Self-pay

## 2019-03-26 VITALS — BP 132/80 | Wt 249.0 lb

## 2019-03-26 DIAGNOSIS — Z4889 Encounter for other specified surgical aftercare: Secondary | ICD-10-CM

## 2019-03-26 NOTE — Progress Notes (Signed)
      Postoperative Follow-up Patient presents post op from laparoscopic right oophorectomy 1weeks ago for adnexal mass.  Subjective: Patient reports marked improvement in her preop symptoms. Eating a regular diet without difficulty. Pain is controlled without any medications.  Activity: normal activities of daily living.  Objective: Blood pressure 132/80, weight 249 lb (112.9 kg), last menstrual period 03/22/2019.  General: NAD Pulmonary: no increased work of breathing Abdomen: soft, non-tender, non-distended, incision(s) D/C/I Extremities: no edema Neurologic: normal gait    Admission on 03/21/2019, Discharged on 03/21/2019  Component Date Value Ref Range Status  . ABO/RH(D) 03/21/2019 O POS   Final  . Antibody Screen 03/21/2019 NEG   Final  . Sample Expiration 03/21/2019    Final                   Value:03/24/2019,2359 Performed at [facility], [address] [place], [place] [address]   . ABO/RH(D) 03/21/2019    Final                   Value:O POS Performed at [facility], [address] [place], [place] [address]   . Preg Test, Ur 03/21/2019 NEGATIVE  NEGATIVE Final   Comment:        THE SENSITIVITY OF THIS METHODOLOGY IS >24 mIU/mL   . SURGICAL PATHOLOGY 03/21/2019    Final-Edited                   Value:SURGICAL PATHOLOGY CASE: [id number] PATIENT: Stephanie Price Surgical Pathology Report     Specimen Submitted: A. Dermoid cyst, right ovary  Clinical History: Pelvic mass R19.00    DIAGNOSIS: A. OVARY, RIGHT; OOPHORECTOMY: - MATURE CYSTIC TERATOMA (DERMOID CYST). - BACKGROUND OVARIAN PARENCHYMA WITH BENIGN PHYSIOLOGIC CHANGES. - NEGATIVE FOR MALIGNANCY.  GROSS DESCRIPTION: A. Labeled: Dermoid cyst right ovary Received: In formalin Type of procedure: Laparoscopic removal of pelvic mass Integrity: Disrupted, received in numerous pieces Weight of specimen: 80 grams Size of specimen:           Ovary: Aggregate 11 x 11 x 2.3  cm  Ovarian surface: Pink-white and relatively smooth Cyst contents: Admixed within the tissue are multiple fragments of pale white soft waxy material and hair.  No normal ovarian parenchyma is grossly noted.  Block summary: 1-3 - representative sections 4-8 - additional representative sections submitted on 03/22/2019   Final Diagn                         osis performed by Allena Napoleon, MD.   Electronically signed 03/25/2019 9:36:48AM The electronic signature indicates that the named Attending Pathologist has evaluated the specimen Technical component performed at [facility], [address] [place], [place] [address] Lab: [phone] Dir: Rush Farmer, MD, MMM  Professional component performed at [facility], [facility], [place], [place], [place] [address] Lab: [phone] Dir: Dellia Nims. Rubinas, MD     Assessment: 29 y.o. s/p laparoscopic right oophorectomy stable  Plan: Patient has done well after surgery with no apparent complications.  I have discussed the post-operative course to date, and the expected progress moving forward.  The patient understands what complications to be concerned about.  I will see the patient in routine follow up, or sooner if needed.    Activity plan: No restriction.   Stephanie Mood, MD, [facility], [facility] 03/26/2019, 4:59 PM

## 2019-03-29 ENCOUNTER — Telehealth: Payer: Self-pay

## 2019-03-29 ENCOUNTER — Encounter: Payer: Self-pay | Admitting: Obstetrics and Gynecology

## 2019-03-29 NOTE — Telephone Encounter (Signed)
I spoke to patient regarding a return note. She states she will be returning on 04/01/19. She is ware you are out of the office today but may be able to see this msg and write it before then.

## 2019-03-29 NOTE — Telephone Encounter (Signed)
Pt called triage line stating she needs a return to work note.

## 2019-04-08 ENCOUNTER — Other Ambulatory Visit: Payer: Self-pay | Admitting: Obstetrics and Gynecology

## 2019-04-08 MED ORDER — NIKKI 3-0.02 MG PO TABS: 1.0000 | ORAL_TABLET | Freq: Every day | ORAL | 3 refills | Status: DC

## 2019-04-08 MED ORDER — ACYCLOVIR 400 MG PO TABS: 400.0000 mg | ORAL_TABLET | Freq: Three times a day (TID) | ORAL | 6 refills | Status: AC

## 2019-04-30 ENCOUNTER — Encounter: Payer: Self-pay | Admitting: Obstetrics and Gynecology

## 2019-04-30 ENCOUNTER — Ambulatory Visit (INDEPENDENT_AMBULATORY_CARE_PROVIDER_SITE_OTHER): Payer: BC Managed Care – PPO | Admitting: Obstetrics and Gynecology

## 2019-04-30 ENCOUNTER — Other Ambulatory Visit: Payer: Self-pay

## 2019-04-30 VITALS — BP 130/68 | Ht 67.0 in | Wt 250.0 lb

## 2019-04-30 DIAGNOSIS — Z4889 Encounter for other specified surgical aftercare: Secondary | ICD-10-CM

## 2019-04-30 NOTE — Progress Notes (Signed)
      Postoperative Follow-up Patient presents post op from laproscopic right oophorectomy 6weeks ago for mature teratoma.  Subjective: Patient reports marked improvement in her preop symptoms. Eating a regular diet without difficulty. The patient is not having any pain.  Activity: normal activities of daily living.  Objective: Blood pressure 130/68, height 5\' 7"  (1.702 m), weight 250 lb (113.4 kg), last menstrual period 04/11/2019.  General: NAD Pulmonary: no increased work of breathing Abdomen: soft, non-tender, non-distended, incision(s) D/C/I Extremities: no edema Neurologic: normal gait    Admission on 03/21/2019, Discharged on 03/21/2019  Component Date Value Ref Range Status  . ABO/RH(D) 03/21/2019 O POS   Final  . Antibody Screen 03/21/2019 NEG   Final  . Sample Expiration 03/21/2019    Final                   Value:03/24/2019,2359 Performed at [facility], [address] [place], [place] [address]   . ABO/RH(D) 03/21/2019    Final                   Value:O POS Performed at [facility], [address], [place], [place] [address]   . Preg Test, Ur 03/21/2019 NEGATIVE  NEGATIVE Final   Comment:        THE SENSITIVITY OF THIS METHODOLOGY IS >24 mIU/mL   . SURGICAL PATHOLOGY 03/21/2019    Final-Edited                   Value:SURGICAL PATHOLOGY CASE: [id number] PATIENT: Lavella Hammock Surgical Pathology Report     Specimen Submitted: A. Dermoid cyst, right ovary  Clinical History: Pelvic mass R19.00    DIAGNOSIS: A. OVARY, RIGHT; OOPHORECTOMY: - MATURE CYSTIC TERATOMA (DERMOID CYST). - BACKGROUND OVARIAN PARENCHYMA WITH BENIGN PHYSIOLOGIC CHANGES. - NEGATIVE FOR MALIGNANCY.  GROSS DESCRIPTION: A. Labeled: Dermoid cyst right ovary Received: In formalin Type of procedure: Laparoscopic removal of pelvic mass Integrity: Disrupted, received in numerous pieces Weight of specimen: 80 grams Size of specimen:           Ovary:  Aggregate 11 x 11 x 2.3 cm  Ovarian surface: Pink-white and relatively smooth Cyst contents: Admixed within the tissue are multiple fragments of pale white soft waxy material and hair.  No normal ovarian parenchyma is grossly noted.  Block summary: 1-3 - representative sections 4-8 - additional representative sections submitted on 03/22/2019   Final Diagn                         osis performed by Allena Napoleon, MD.   Electronically signed 03/25/2019 9:36:48AM The electronic signature indicates that the named Attending Pathologist has evaluated the specimen Technical component performed at [facility], [address], [place], [place] [address] Lab: [phone] Dir: Rush Farmer, MD, MMM  Professional component performed at [facility], [facility], [place], [place], [place] [address] Lab: [phone] Dir: Dellia Nims. Rubinas, MD     Assessment: 29 y.o. s/p Laparoscopic right oophorectomy stable  Plan: Patient has done well after surgery with no apparent complications.  I have discussed the post-operative course to date, and the expected progress moving forward.  The patient understands what complications to be concerned about.  I will see the patient in routine follow up, or sooner if needed.    Activity plan: No restriction.   [name], MD, [facility], [facility] 04/30/2019, 2:46 PM

## 2019-08-13 ENCOUNTER — Other Ambulatory Visit: Payer: Self-pay | Admitting: Obstetrics and Gynecology

## 2019-08-13 DIAGNOSIS — Z1329 Encounter for screening for other suspected endocrine disorder: Secondary | ICD-10-CM

## 2019-08-13 DIAGNOSIS — N939 Abnormal uterine and vaginal bleeding, unspecified: Secondary | ICD-10-CM

## 2019-08-13 NOTE — Telephone Encounter (Signed)
Patient is schedule for labs on 08/15/19

## 2019-08-13 NOTE — Telephone Encounter (Signed)
Labs in for anytime this week

## 2019-08-15 ENCOUNTER — Other Ambulatory Visit: Payer: Self-pay

## 2019-08-15 ENCOUNTER — Other Ambulatory Visit: Payer: BC Managed Care – PPO

## 2019-08-15 DIAGNOSIS — Z1329 Encounter for screening for other suspected endocrine disorder: Secondary | ICD-10-CM

## 2019-08-15 DIAGNOSIS — N939 Abnormal uterine and vaginal bleeding, unspecified: Secondary | ICD-10-CM | POA: Diagnosis not present

## 2019-08-16 LAB — THYROID PANEL WITH TSH
Free Thyroxine Index: 2.2 (ref 1.2–4.9)
T3 Uptake Ratio: 23 % — ABNORMAL LOW (ref 24–39)
T4, Total: 9.6 ug/dL (ref 4.5–12.0)
TSH: 0.878 u[IU]/mL (ref 0.450–4.500)

## 2019-08-16 LAB — CBC
Hematocrit: 48.3 % — ABNORMAL HIGH (ref 34.0–46.6)
Hemoglobin: 15.9 g/dL (ref 11.1–15.9)
MCH: 29.9 pg (ref 26.6–33.0)
MCHC: 32.9 g/dL (ref 31.5–35.7)
MCV: 91 fL (ref 79–97)
Platelets: 322 10*3/uL (ref 150–450)
RBC: 5.31 x10E6/uL — ABNORMAL HIGH (ref 3.77–5.28)
RDW: 12.1 % (ref 11.7–15.4)
WBC: 8.4 10*3/uL (ref 3.4–10.8)

## 2019-08-22 ENCOUNTER — Telehealth: Payer: Self-pay

## 2019-08-22 NOTE — Telephone Encounter (Signed)
Patient inquiring about 08/15/2019 Abnormal Thyroid results.

## 2019-08-22 NOTE — Telephone Encounter (Signed)
Spoke w/patient. Advised AMS out of the office until Tuesday. Reviewed labs to note that flagged values are barely out of range. Likely not of concern. Patient discussed the reason she had asked for labs was d/t having chest pain a few weeks ago. She takes meds for acid reflux. She has had some bowel issues recently with diarrhea. Advised to monitor and may need to seen/evaluated for possible Gallstones.

## 2019-08-27 ENCOUNTER — Other Ambulatory Visit: Payer: Self-pay | Admitting: Obstetrics and Gynecology

## 2019-08-27 DIAGNOSIS — R1011 Right upper quadrant pain: Secondary | ICD-10-CM

## 2019-08-27 NOTE — Progress Notes (Signed)
Right upper quadrant pain and loose stools.  Intermittent.  Also worsening GERD symptoms.  Will start with RUQ [place] if concern for gallstones will send to general surgery, if clear will send to GI.

## 2019-09-02 ENCOUNTER — Ambulatory Visit: Admission: RE | Admit: 2019-09-02 | Payer: BC Managed Care – PPO | Source: Ambulatory Visit

## 2019-09-11 ENCOUNTER — Other Ambulatory Visit: Payer: Self-pay

## 2019-09-11 ENCOUNTER — Ambulatory Visit
Admission: RE | Admit: 2019-09-11 | Discharge: 2019-09-11 | Disposition: A | Discharge status: Home / Self Care | Payer: BC Managed Care – PPO | Source: Ambulatory Visit | Attending: Obstetrics and Gynecology | Admitting: Obstetrics and Gynecology

## 2019-09-11 DIAGNOSIS — R1011 Right upper quadrant pain: Secondary | ICD-10-CM | POA: Insufficient documentation

## 2019-09-11 DIAGNOSIS — K8020 Calculus of gallbladder without cholecystitis without obstruction: Secondary | ICD-10-CM | POA: Diagnosis not present

## 2019-09-13 ENCOUNTER — Other Ambulatory Visit: Payer: Self-pay | Admitting: Obstetrics and Gynecology

## 2019-09-13 DIAGNOSIS — K8050 Calculus of bile duct without cholangitis or cholecystitis without obstruction: Secondary | ICD-10-CM

## 2019-09-13 DIAGNOSIS — K8000 Calculus of gallbladder with acute cholecystitis without obstruction: Secondary | ICD-10-CM

## 2019-09-25 ENCOUNTER — Encounter: Payer: Self-pay | Admitting: Surgery

## 2019-09-25 ENCOUNTER — Ambulatory Visit (INDEPENDENT_AMBULATORY_CARE_PROVIDER_SITE_OTHER): Payer: BC Managed Care – PPO | Admitting: Surgery

## 2019-09-25 ENCOUNTER — Other Ambulatory Visit: Payer: Self-pay

## 2019-09-25 VITALS — BP 132/88 | HR 99 | Temp 98.1°F | Resp 12 | Ht 67.0 in | Wt 243.4 lb

## 2019-09-25 DIAGNOSIS — K219 Gastro-esophageal reflux disease without esophagitis: Secondary | ICD-10-CM | POA: Diagnosis not present

## 2019-09-25 DIAGNOSIS — K8020 Calculus of gallbladder without cholecystitis without obstruction: Secondary | ICD-10-CM

## 2019-09-25 NOTE — Progress Notes (Signed)
09/25/2019  Reason for Visit:  Cholelithiasis  Referring Provider:  [name], MD  History of Present Illness: Stephanie Price is a 29 y.o. female presenting for evaluation of recently diagnosed cholelithiasis.  The patient is s/p laparoscopic right oopherectomy with Dr. Georgianne Fick on 03/21/19.  She initially had some abdominal pain after surgery and that later improved after the first week.  However, she reports she's been having issues with upper abdominal pain.  She reports that she will get epigastric pain after eating, and the triggers are usually greasy foods and spicy foods, steaks.  The pain then travels superiorly up her chest and towards her neck.  At that point, she reports feeling burning sensation and bubbling in her throat.  Within the abdomen, the pain remain in the epigastric area and has not been in the right upper quadrant, and it also has not radiated towards her back.  She reports she has issues with acid reflux, and when she takes her Protonix consistently, she does not have problems, but then she'll miss doses and the issues come back.  She reports that she uses pillows to prop her up to sleep, otherwise if she lies flat she will wake up in the middle of the night with pain.  Denies any fevers or chills.  She does report diarrhea, and she feels that the food will go through her quickly and she'll have loose stools.  Past Medical History: Past Medical History:  Diagnosis Date  . GERD (gastroesophageal reflux disease)      Past Surgical History: Past Surgical History:  Procedure Laterality Date  . LAPAROSCOPIC OVARIAN CYSTECTOMY N/A 03/21/2019   Procedure: LAPAROSCOPIC REMOVAL OF PELVIC MASS;  Surgeon: [name], MD;  Location: [facility] ORS;  Service: Gynecology;  Laterality: N/A;  . TONSILLECTOMY      Home Medications: Prior to Admission medications   Medication Sig Start Date End Date Taking? Authorizing Provider  acetaminophen (TYLENOL) 325 MG tablet Take 650  mg by mouth every 6 (six) hours as needed for moderate pain or headache.   Yes [provider]  acyclovir (ZOVIRAX) 200 MG capsule Take 200 mg by mouth 5 (five) times daily.   Yes [provider]  acyclovir (ZOVIRAX) 400 MG tablet Take 400 mg by mouth 3 (three) times daily. 09/13/19  Yes [provider]  pantoprazole (PROTONIX) 40 MG tablet Take 40 mg by mouth daily as needed (GERD).    Yes [provider]    Allergies: No Known Allergies  Social History:  reports that she has been smoking cigarettes. She has been smoking about 0.50 packs per day. She has never used smokeless tobacco. She reports current alcohol use. She reports that she does not use drugs.   Family History: History reviewed. No pertinent family history.  Review of Systems: Review of Systems  Constitutional: Negative for chills and fever.  HENT: Negative for hearing loss.   Respiratory: Negative for shortness of breath.   Cardiovascular: Negative for chest pain.  Gastrointestinal: Positive for abdominal pain, diarrhea and heartburn. Negative for nausea and vomiting.  Genitourinary: Negative for dysuria.  Musculoskeletal: Negative for myalgias.  Skin: Negative for rash.  Neurological: Negative for dizziness.  Psychiatric/Behavioral: Negative for depression.    Physical Exam BP 132/88   Pulse 99   Temp 98.1 F (36.7 C)   Resp 12   Ht 5\' 7"  (1.702 m)   Wt 243 lb 6.4 oz (110.4 kg)   SpO2 98%   BMI 38.12 kg/m  CONSTITUTIONAL:  No acute distress HEENT:  Normocephalic, atraumatic, extraocular motion intact. NECK: Trachea is midline, and there is no jugular venous distension.  RESPIRATORY:  Lungs are clear, and breath sounds are equal bilaterally. Normal respiratory effort without pathologic use of accessory muscles. CARDIOVASCULAR: Heart is regular without murmurs, gallops, or rubs. GI: The abdomen is soft, obese, non-distended, non-tender to palpation.  Negative Murphy's sign.   MUSCULOSKELETAL:  Normal muscle strength and tone in all four extremities.  No peripheral edema or cyanosis. SKIN: Skin turgor is normal. There are no pathologic skin lesions.  NEUROLOGIC:  Motor and sensation is grossly normal.  Cranial nerves are grossly intact. PSYCH:  Alert and oriented to person, place and time. Affect is normal.  Laboratory Analysis: Labs from 08/15/19: WBC 8.4, Hgb 15.9, Hct 48.3, Plt 322.  Imaging: Ultrasound 09/11/19: FINDINGS: Gallbladder: There is a mobile 1.1 cm gallstone. No gallbladder wall thickening. No sonographic Murphy sign noted by sonographer.  Common bile duct: Diameter: 0.3 cm, within normal limits.  Liver: No focal lesion identified. Within normal limits in parenchymal echogenicity. Portal vein is patent on color Doppler imaging with normal direction of blood flow towards the liver.  Other: None.  IMPRESSION: Cholelithiasis without evidence of acute cholecystitis.  Assessment and Plan: This is a 29 y.o. female with GERD and cholelithiasis.  --Discussed with the patient that overall her symptoms mostly point to issues with GERD and reflux, and less so to her cholelithiasis.  Her pain occurring at night if she lies flat, the pain going from epigastric area up her chest and to her throat are more likely to be issue with reflux.  Unclear that the diarrhea could be either from GERD or cholelithiasis.  I think at this point, it would be best to try to manage the GERD more aggressively and see what happens with her symptoms.  Discussed with her that she should take her Protonix daily without missing doses, and if she notices it's not working as well, change to Prilosec.  She should avoid spicy foods, acidic foods, and can also avoid greasy foods.  Continue using pillows to prop up at night.  If her symptoms improve and/or resolve, then there would be no need for cholecystectomy and this was just an incidental finding of cholelithiasis.  If some  symptoms resolve, but others now more point to her gallbladder, then we can discuss surgery.  If there's no change, then would refer to GI for further evaluation of her GERD. --Follow up in one month to check on her progress.  Face-to-face time spent with the patient and care providers was 40 minutes, with more than 50% of the time spent counseling, educating, and coordinating care of the patient.     Melvyn Neth, [facility]

## 2019-09-25 NOTE — Patient Instructions (Addendum)
Begin taking your Protonix everyday. Avoid spicy foods, caffeine and acidic foods to help with reflux. You may use Prilosec if Protonix does not help.   See your appointment below. Call the office if you have any questions or concerns.

## 2019-10-17 ENCOUNTER — Other Ambulatory Visit: Payer: Self-pay | Admitting: Obstetrics and Gynecology

## 2019-10-17 NOTE — Telephone Encounter (Signed)
Advise

## 2019-10-24 DIAGNOSIS — J209 Acute bronchitis, unspecified: Secondary | ICD-10-CM | POA: Diagnosis not present

## 2019-10-24 DIAGNOSIS — J019 Acute sinusitis, unspecified: Secondary | ICD-10-CM | POA: Diagnosis not present

## 2019-10-30 ENCOUNTER — Other Ambulatory Visit: Payer: Self-pay

## 2019-10-30 ENCOUNTER — Ambulatory Visit (INDEPENDENT_AMBULATORY_CARE_PROVIDER_SITE_OTHER): Payer: BC Managed Care – PPO | Admitting: Surgery

## 2019-10-30 ENCOUNTER — Encounter: Payer: Self-pay | Admitting: Surgery

## 2019-10-30 VITALS — BP 130/87 | HR 109 | Temp 98.1°F | Resp 12 | Wt 237.8 lb

## 2019-10-30 DIAGNOSIS — K8020 Calculus of gallbladder without cholecystitis without obstruction: Secondary | ICD-10-CM

## 2019-10-30 DIAGNOSIS — K219 Gastro-esophageal reflux disease without esophagitis: Secondary | ICD-10-CM | POA: Diagnosis not present

## 2019-10-30 NOTE — Patient Instructions (Addendum)
We have placed a referral to [facility]. Their office should be in contact with you to set up an appointment within 7-10 days. Please call the office if you do not hear from their office.   You may try Omeprazole(Prilosec) as an alternative for your GERD.   Follow up as needed. Call the office if you have any questions or concerns.

## 2019-10-30 NOTE — Progress Notes (Signed)
10/30/2019  History of Present Illness: Stephanie Price is a 29 y.o. female presenting for follow-up of cholelithiasis.  Patient had an ultrasound on 09/11/2019 showing cholelithiasis but no cholecystitis.  Labs are in the office on 09/25/2019, 2 The majority of her symptoms were related to GERD that had been cholelithiasis.  Since her visit, she has been taking Protonix daily and her symptoms have improved significantly.  She does report that if she misses a dose however her symptoms do go back relatively quickly.  She is wondering if there is a better medication to take instead of Protonix.  Patient reports that she still having issues with diarrhea/loose stools.  Past Medical History: Past Medical History:  Diagnosis Date  . GERD (gastroesophageal reflux disease)      Past Surgical History: Past Surgical History:  Procedure Laterality Date  . LAPAROSCOPIC OVARIAN CYSTECTOMY N/A 03/21/2019   Procedure: LAPAROSCOPIC REMOVAL OF PELVIC MASS;  Surgeon: [name], MD;  Location: [facility] ORS;  Service: Gynecology;  Laterality: N/A;  . TONSILLECTOMY      Home Medications: Prior to Admission medications   Medication Sig Start Date End Date Taking? Authorizing Provider  acetaminophen (TYLENOL) 325 MG tablet Take 650 mg by mouth every 6 (six) hours as needed for moderate pain or headache.   Yes [provider]  acyclovir (ZOVIRAX) 200 MG capsule Take 200 mg by mouth 5 (five) times daily.   Yes [provider]  acyclovir (ZOVIRAX) 400 MG tablet Take 400 mg by mouth 3 (three) times daily. 09/13/19  Yes [provider]  amoxicillin-clavulanate (AUGMENTIN) 875-125 MG tablet Take 1 tablet by mouth 2 (two) times daily. 10/24/19  Yes [provider]  pantoprazole (PROTONIX) 40 MG tablet TAKE 1 TABLET BY MOUTH TWICE A DAY FOR 2 WEEKS THEN TAKE 1 TABLET EVERY DAY FOR 6 MORE WEEKS 10/18/19  Yes [name], MD    Allergies: No Known Allergies  Review of  Systems: Review of Systems  Constitutional: Negative for chills and fever.  Respiratory: Negative for shortness of breath.   Cardiovascular: Negative for chest pain.  Gastrointestinal: Positive for diarrhea. Negative for abdominal pain, nausea and vomiting.    Physical Exam BP 130/87   Pulse (!) 109   Temp 98.1 F (36.7 C)   Resp 12   Wt 237 lb 12.8 oz (107.9 kg)   BMI 37.24 kg/m  CONSTITUTIONAL: No acute distress, well-nourished HEENT:  Normocephalic, atraumatic, extraocular motion intact. RESPIRATORY:  Normal respiratory effort without pathologic use of accessory muscles. CARDIOVASCULAR: Regular rhythm and rate  GI: The abdomen is soft, obese, nondistended, currently nontender to palpation with only some mild pressure sensation in the mid abdomen and epigastric region. NEUROLOGIC:  Motor and sensation is grossly normal.  Cranial nerves are grossly intact. PSYCH:  Alert and oriented to person, place and time. Affect is normal.  Labs/Imaging: None recently  Assessment and Plan: This is a 29 y.o. female with abdominal pain with ultrasound finding of cholelithiasis, but also with GERD-like symptoms.  -Discussed with the patient that if her tonics daily consistently is helping with all her symptoms, then I do not think that this would be related to cholelithiasis and I do not think that surgery is indicated at this point. -However the Protonix may not be working as effectively for her anymore.  I suggested that she try Prilosec to see if that would help better.  She is also still having some loose stools.  I think as a precaution, we will  send a referral to gastroenterology for further evaluation and management. -Patient to follow-up with [place] as needed.  Face-to-face time spent with the patient and care providers was 15 minutes, with more than 50% of the time spent counseling, educating, and coordinating care of the patient.     Melvyn Neth, [facility]

## 2019-10-31 DIAGNOSIS — R051 Acute cough: Secondary | ICD-10-CM | POA: Diagnosis not present

## 2019-10-31 DIAGNOSIS — J209 Acute bronchitis, unspecified: Secondary | ICD-10-CM | POA: Diagnosis not present

## 2019-11-07 ENCOUNTER — Other Ambulatory Visit: Payer: Self-pay | Admitting: Obstetrics and Gynecology

## 2019-11-07 MED ORDER — NITROFURANTOIN MONOHYD MACRO 100 MG PO CAPS: 100.0000 mg | ORAL_CAPSULE | Freq: Two times a day (BID) | ORAL | 0 refills | Status: AC

## 2019-11-07 NOTE — Progress Notes (Signed)
UTI symptoms empiric treatment macrobid with culture if fails to improve

## 2019-11-09 ENCOUNTER — Other Ambulatory Visit: Payer: Self-pay | Admitting: Obstetrics and Gynecology

## 2019-12-02 ENCOUNTER — Other Ambulatory Visit: Payer: Self-pay | Admitting: Obstetrics and Gynecology

## 2019-12-03 NOTE — Telephone Encounter (Signed)
Please advise 

## 2019-12-13 ENCOUNTER — Encounter: Payer: Self-pay | Admitting: Surgery

## 2019-12-13 DIAGNOSIS — K8000 Calculus of gallbladder with acute cholecystitis without obstruction: Secondary | ICD-10-CM | POA: Diagnosis not present

## 2019-12-16 DIAGNOSIS — K8020 Calculus of gallbladder without cholecystitis without obstruction: Secondary | ICD-10-CM | POA: Diagnosis not present

## 2019-12-16 DIAGNOSIS — R1013 Epigastric pain: Secondary | ICD-10-CM | POA: Diagnosis not present

## 2019-12-17 ENCOUNTER — Ambulatory Visit: Payer: BC Managed Care – PPO | Admitting: Surgery

## 2019-12-17 ENCOUNTER — Other Ambulatory Visit: Payer: Self-pay | Admitting: Family Medicine

## 2019-12-17 DIAGNOSIS — R1013 Epigastric pain: Secondary | ICD-10-CM

## 2019-12-17 DIAGNOSIS — K8020 Calculus of gallbladder without cholecystitis without obstruction: Secondary | ICD-10-CM

## 2019-12-18 ENCOUNTER — Ambulatory Visit: Payer: BC Managed Care – PPO | Admitting: Surgery

## 2019-12-18 ENCOUNTER — Ambulatory Visit: Payer: Self-pay | Admitting: General Surgery

## 2019-12-18 DIAGNOSIS — K8020 Calculus of gallbladder without cholecystitis without obstruction: Secondary | ICD-10-CM | POA: Diagnosis not present

## 2019-12-18 NOTE — H&P (Signed)
PATIENT PROFILE: Stephanie Price is a 29 y.o. female who presents to the Clinic for consultation at the request of Stephanie Price for evaluation of cholelithiasis.  PCP:  Stephanie Number, MD  HISTORY OF PRESENT ILLNESS: Stephanie Price reports has been having pain in her abdomen since 21-month ago.  She reported the pain started on the epigastric area.  The pain radiates to both of her upper quadrant.  The pain also radiates to her back.  This has been associated with nausea.  She denies any fever or chills.  She had an ultrasound done showing cholelithiasis without sign of cholecystitis.  I personally evaluated the images.  She has been treated for gastroesophageal reflux before.  The GERD medication does not change this pain and has not prevented.  There has been no alleviating or aggravating factor.   PROBLEM LIST:         Problem List  Date Reviewed: 05/30/2016         Noted   Tobacco use Unknown      GENERAL REVIEW OF SYSTEMS:   General ROS: negative for - chills, fatigue, fever, weight gain or weight loss Allergy and Immunology ROS: negative for - hives  Hematological and Lymphatic ROS: negative for - bleeding problems or bruising, negative for palpable nodes Endocrine ROS: negative for - heat or cold intolerance, hair changes Respiratory ROS: negative for - cough, shortness of breath or wheezing Cardiovascular ROS: no chest pain or palpitations GI ROS: negative for nausea, vomiting, diarrhea, constipation.  Positive for abdominal pain Musculoskeletal ROS: negative for - joint swelling or muscle pain Neurological ROS: negative for - confusion, syncope Dermatological ROS: negative for pruritus and rash Psychiatric: negative for anxiety, depression, difficulty sleeping and memory loss  MEDICATIONS: Current Medications        Current Outpatient Medications  Medication Sig Dispense Refill  . acyclovir (ZOVIRAX) 400 MG tablet Take 400 mg by mouth 3 (three) times daily as  needed    . [name], 28, 3-0.02 mg tablet once daily    . pantoprazole (PROTONIX) 40 MG DR tablet once daily    . traMADoL (ULTRAM) 50 mg tablet as needed    . HYDROcodone-acetaminophen (NORCO) 5-325 mg tablet Take 1 tablet by mouth every 4 (four) hours as needed for Pain for up to 20 doses. (Patient not taking: Reported on 12/18/2019  ) 20 tablet 0  . metFORMIN (GLUCOPHAGE) 1000 MG tablet Take 2,000 mg by mouth once daily.   (Patient not taking: Reported on 12/18/2019  )     No current facility-administered medications for this visit.      ALLERGIES: Patient has no known allergies.  PAST MEDICAL HISTORY:     Past Medical History:  Diagnosis Date  . PCOS (polycystic ovarian syndrome)    thinks this was a misdiagnosis  . Tobacco use     PAST SURGICAL HISTORY:      Past Surgical History:  Procedure Laterality Date  . CYSTECTOMY OVARY Right 2021  . OOPHORECTOMY Right 2021   w/ Falopian tube  . TONSILLECTOMY       FAMILY HISTORY:      Family History  Problem Relation Age of Onset  . No Known Problems Mother   . No Known Problems Father   . Stroke Paternal Aunt   . Diabetes type II Maternal Grandmother   . Stroke Maternal Grandmother   . Diabetes type II Paternal Grandmother   . Stroke Paternal Grandmother      SOCIAL HISTORY:  Social History          Socioeconomic History  . Marital status: Married    Spouse name: Not on file  . Price of children: Not on file  . Years of education: Not on file  . Highest education level: Not on file  Occupational History  . Not on file  Tobacco Use  . Smoking status: Current Every Day Smoker    Packs/day: 0.50  . Smokeless tobacco: Never Used  . Tobacco comment: 8 cigs per day  Vaping Use  . Vaping Use: Never used  Substance and Sexual Activity  . Alcohol use: Yes  . Drug use: No  . Sexual activity: Yes    Partners: Male    Birth control/protection: None  Other Topics Concern  .  Not on file  Social History Narrative  . Not on file   Social Determinants of Health   Financial Resource Strain: Not on file  Food Insecurity: Not on file  Transportation Needs: Not on file      PHYSICAL EXAM:    Vitals:   12/18/19 1007  BP: (!) 150/98  Pulse: 90   Body mass index is 38.37 kg/m. Weight: (!) 111.1 kg (245 lb)   GENERAL: Alert, active, oriented x3  HEENT: Pupils equal reactive to light. Extraocular movements are intact. Sclera clear. Palpebral conjunctiva normal red color.Pharynx clear.  NECK: Supple with no palpable mass and no adenopathy.  LUNGS: Sound clear with no rales rhonchi or wheezes.  HEART: Regular rhythm S1 and S2 without murmur.  ABDOMEN: Soft and depressible, nontender with no palpable mass, no hepatomegaly.   EXTREMITIES: Well-developed well-nourished symmetrical with no dependent edema.  NEUROLOGICAL: Awake alert oriented, facial expression symmetrical, moving all extremities.  REVIEW OF DATA: I have reviewed the following data today:      Initial consult on 12/18/2019  Component Date Value  . WBC (White Blood Cell Co* 12/18/2019 8.6   . RBC (Red Blood Cell Coun* 12/18/2019 4.88   . Hemoglobin 12/18/2019 14.5   . Hematocrit 12/18/2019 44.3   . MCV (Mean Corpuscular Vo* 12/18/2019 90.8   . MCH (Mean Corpuscular He* 12/18/2019 29.7   . MCHC (Mean Corpuscular H* 12/18/2019 32.7   . Platelet Count 12/18/2019 278   . RDW-CV (Red Cell Distrib* 12/18/2019 11.9   . MPV (Mean Platelet Volum* 12/18/2019 11.5   . Neutrophils 12/18/2019 5.36   . Lymphocytes 12/18/2019 2.17   . Monocytes 12/18/2019 0.77   . Eosinophils 12/18/2019 0.27   . Basophils 12/18/2019 0.05   . Neutrophil % 12/18/2019 62.1   . Lymphocyte % 12/18/2019 25.1   . Monocyte % 12/18/2019 8.9   . Eosinophil % 12/18/2019 3.1   . Basophil% 12/18/2019 0.6   . Immature Granulocyte % 12/18/2019 0.2   . Immature Granulocyte Cou* 12/18/2019 0.02       ASSESSMENT: Stephanie Price is a 29 y.o. female presenting for consultation for cholelithiasis.    Patient was oriented about the diagnosis of cholelithiasis. Also oriented about what is the gallbladder, its anatomy and function and the implications of having stones. The patient was oriented about the treatment alternatives (observation vs cholecystectomy). Patient was oriented that a low percentage of patient will continue to have similar pain symptoms even after the gallbladder is removed. Surgical technique (open vs laparoscopic) was discussed. It was also discussed the goals of the surgery (decrease the pain episodes and avoid the risk of cholecystitis) and the risk of surgery including: bleeding, infection, common bile  duct injury, stone retention, injury to other organs such as bowel, liver, stomach, other complications such as hernia, bowel obstruction among others. Also discussed with patient about anesthesia and its complications such as: reaction to medications, pneumonia, heart complications, death, among others.  Cholelithiasis without cholecystitis [K80.20]  PLAN: 1. Robotic assisted laparoscopic cholecystectomy (39179) 2.  CBC, CMP 3.  Do not take aspirin 5 days before the procedure 4.  Contact us if has any question or concern.  Patient verbalized understanding, all questions were answered, and were agreeable with the plan outlined above.   Herbert Pun, MD  Electronically signed by Herbert Pun, MD

## 2019-12-18 NOTE — H&P (View-Only) (Signed)
PATIENT PROFILE: Stephanie Price is a 29 y.o. female who presents to the Clinic for consultation at the request of Dr. Clemmie Krill for evaluation of cholelithiasis.  PCP:  Rich Number, MD  HISTORY OF PRESENT ILLNESS: Stephanie Price reports has been having pain in her abdomen since 67-month ago.  She reported the pain started on the epigastric area.  The pain radiates to both of her upper quadrant.  The pain also radiates to her back.  This has been associated with nausea.  She denies any fever or chills.  She had an ultrasound done showing cholelithiasis without sign of cholecystitis.  I personally evaluated the images.  She has been treated for gastroesophageal reflux before.  The GERD medication does not change this pain and has not prevented.  There has been no alleviating or aggravating factor.   PROBLEM LIST:         Problem List  Date Reviewed: 05/30/2016         Noted   Tobacco use Unknown      GENERAL REVIEW OF SYSTEMS:   General ROS: negative for - chills, fatigue, fever, weight gain or weight loss Allergy and Immunology ROS: negative for - hives  Hematological and Lymphatic ROS: negative for - bleeding problems or bruising, negative for palpable nodes Endocrine ROS: negative for - heat or cold intolerance, hair changes Respiratory ROS: negative for - cough, shortness of breath or wheezing Cardiovascular ROS: no chest pain or palpitations GI ROS: negative for nausea, vomiting, diarrhea, constipation.  Positive for abdominal pain Musculoskeletal ROS: negative for - joint swelling or muscle pain Neurological ROS: negative for - confusion, syncope Dermatological ROS: negative for pruritus and rash Psychiatric: negative for anxiety, depression, difficulty sleeping and memory loss  MEDICATIONS: Current Medications        Current Outpatient Medications  Medication Sig Dispense Refill  . acyclovir (ZOVIRAX) 400 MG tablet Take 400 mg by mouth 3 (three) times daily as  needed    . [name], 28, 3-0.02 mg tablet once daily    . pantoprazole (PROTONIX) 40 MG DR tablet once daily    . traMADoL (ULTRAM) 50 mg tablet as needed    . HYDROcodone-acetaminophen (NORCO) 5-325 mg tablet Take 1 tablet by mouth every 4 (four) hours as needed for Pain for up to 20 doses. (Patient not taking: Reported on 12/18/2019  ) 20 tablet 0  . metFORMIN (GLUCOPHAGE) 1000 MG tablet Take 2,000 mg by mouth once daily.   (Patient not taking: Reported on 12/18/2019  )     No current facility-administered medications for this visit.      ALLERGIES: Patient has no known allergies.  PAST MEDICAL HISTORY:     Past Medical History:  Diagnosis Date  . PCOS (polycystic ovarian syndrome)    thinks this was a misdiagnosis  . Tobacco use     PAST SURGICAL HISTORY:      Past Surgical History:  Procedure Laterality Date  . CYSTECTOMY OVARY Right 2021  . OOPHORECTOMY Right 2021   w/ Falopian tube  . TONSILLECTOMY       FAMILY HISTORY:      Family History  Problem Relation Age of Onset  . No Known Problems Mother   . No Known Problems Father   . Stroke Paternal Aunt   . Diabetes type II Maternal Grandmother   . Stroke Maternal Grandmother   . Diabetes type II Paternal Grandmother   . Stroke Paternal Grandmother      SOCIAL HISTORY:  Social History          Socioeconomic History  . Marital status: Married    Spouse name: Not on file  . Number of children: Not on file  . Years of education: Not on file  . Highest education level: Not on file  Occupational History  . Not on file  Tobacco Use  . Smoking status: Current Every Day Smoker    Packs/day: 0.50  . Smokeless tobacco: Never Used  . Tobacco comment: 8 cigs per day  Vaping Use  . Vaping Use: Never used  Substance and Sexual Activity  . Alcohol use: Yes  . Drug use: No  . Sexual activity: Yes    Partners: Male    Birth control/protection: None  Other Topics Concern  .  Not on file  Social History Narrative  . Not on file   Social Determinants of Health   Financial Resource Strain: Not on file  Food Insecurity: Not on file  Transportation Needs: Not on file      PHYSICAL EXAM:    Vitals:   12/18/19 1007  BP: (!) 150/98  Pulse: 90   Body mass index is 38.37 kg/m. Weight: (!) 111.1 kg (245 lb)   GENERAL: Alert, active, oriented x3  HEENT: Pupils equal reactive to light. Extraocular movements are intact. Sclera clear. Palpebral conjunctiva normal red color.Pharynx clear.  NECK: Supple with no palpable mass and no adenopathy.  LUNGS: Sound clear with no rales rhonchi or wheezes.  HEART: Regular rhythm S1 and S2 without murmur.  ABDOMEN: Soft and depressible, nontender with no palpable mass, no hepatomegaly.   EXTREMITIES: Well-developed well-nourished symmetrical with no dependent edema.  NEUROLOGICAL: Awake alert oriented, facial expression symmetrical, moving all extremities.  REVIEW OF DATA: I have reviewed the following data today:      Initial consult on 12/18/2019  Component Date Value  . WBC (White Blood Cell Co* 12/18/2019 8.6   . RBC (Red Blood Cell Coun* 12/18/2019 4.88   . Hemoglobin 12/18/2019 14.5   . Hematocrit 12/18/2019 44.3   . MCV (Mean Corpuscular Vo* 12/18/2019 90.8   . MCH (Mean Corpuscular He* 12/18/2019 29.7   . MCHC (Mean Corpuscular H* 12/18/2019 32.7   . Platelet Count 12/18/2019 278   . RDW-CV (Red Cell Distrib* 12/18/2019 11.9   . MPV (Mean Platelet Volum* 12/18/2019 11.5   . Neutrophils 12/18/2019 5.36   . Lymphocytes 12/18/2019 2.17   . Monocytes 12/18/2019 0.77   . Eosinophils 12/18/2019 0.27   . Basophils 12/18/2019 0.05   . Neutrophil % 12/18/2019 62.1   . Lymphocyte % 12/18/2019 25.1   . Monocyte % 12/18/2019 8.9   . Eosinophil % 12/18/2019 3.1   . Basophil% 12/18/2019 0.6   . Immature Granulocyte % 12/18/2019 0.2   . Immature Granulocyte Cou* 12/18/2019 0.02       ASSESSMENT: Stephanie Price is a 29 y.o. female presenting for consultation for cholelithiasis.    Patient was oriented about the diagnosis of cholelithiasis. Also oriented about what is the gallbladder, its anatomy and function and the implications of having stones. The patient was oriented about the treatment alternatives (observation vs cholecystectomy). Patient was oriented that a low percentage of patient will continue to have similar pain symptoms even after the gallbladder is removed. Surgical technique (open vs laparoscopic) was discussed. It was also discussed the goals of the surgery (decrease the pain episodes and avoid the risk of cholecystitis) and the risk of surgery including: bleeding, infection, common bile  duct injury, stone retention, injury to other organs such as bowel, liver, stomach, other complications such as hernia, bowel obstruction among others. Also discussed with patient about anesthesia and its complications such as: reaction to medications, pneumonia, heart complications, death, among others.  Cholelithiasis without cholecystitis [K80.20]  PLAN: 1. Robotic assisted laparoscopic cholecystectomy (31594) 2.  CBC, CMP 3.  Do not take aspirin 5 days before the procedure 4.  Contact us if has any question or concern.  Patient verbalized understanding, all questions were answered, and were agreeable with the plan outlined above.   Herbert Pun, MD  Electronically signed by Herbert Pun, MD

## 2019-12-20 ENCOUNTER — Ambulatory Visit: Payer: BC Managed Care – PPO

## 2019-12-20 ENCOUNTER — Other Ambulatory Visit: Payer: Self-pay

## 2019-12-20 ENCOUNTER — Other Ambulatory Visit
Admission: RE | Admit: 2019-12-20 | Discharge: 2019-12-20 | Disposition: A | Discharge status: Home / Self Care | Payer: BC Managed Care – PPO | Source: Ambulatory Visit | Attending: General Surgery | Admitting: General Surgery

## 2019-12-20 DIAGNOSIS — Z01812 Encounter for preprocedural laboratory examination: Secondary | ICD-10-CM | POA: Insufficient documentation

## 2019-12-20 DIAGNOSIS — Z20822 Contact with and (suspected) exposure to covid-19: Secondary | ICD-10-CM | POA: Insufficient documentation

## 2019-12-20 HISTORY — DX: Pneumonia, unspecified organism: J18.9

## 2019-12-20 NOTE — Patient Instructions (Addendum)
Your procedure is scheduled on:12/24/19 - Tuesday Report to the Registration Desk on the 1st floor of the [facility]. To find out your arrival time, please call [phone] between 1PM - 3PM on: 12/22/19- Monday  REMEMBER: Instructions that are not followed completely may result in serious medical risk, up to and including death; or upon the discretion of your surgeon and anesthesiologist your surgery may need to be rescheduled.  Do not eat food after midnight the night before surgery.  No gum chewing, lozengers or hard candies.  You may however, drink CLEAR liquids up to 2 hours before you are scheduled to arrive for your surgery. Do not drink anything within 2 hours of your scheduled arrival time.  Clear liquids include: - water  - apple juice without pulp - gatorade (not RED, PURPLE, OR BLUE) - black coffee or tea (Do NOT add milk or creamers to the coffee or tea) Do NOT drink anything that is not on this list.  TAKE THESE MEDICATIONS THE MORNING OF SURGERY WITH A SIP OF WATER:    - PROTONIX 40 MG tablet, take one the night before and one on the morning of surgery - helps to prevent nausea after surgery. - ondansetron (ZOFRAN) 4 MG tablet if needed - traMADol (ULTRAM) 50 MG tablet if needed  One week prior to surgery: Stop [facility] Headache 12/20/19 Stop Anti-inflammatories (NSAIDS) such as Advil, Aleve, Ibuprofen, Motrin, Naproxen, Naprosyn and Aspirin based products such as Excedrin, Goodys Powder, BC Powder. Stop ANY OVER THE COUNTER supplements until after surgery. (However, you may continue taking Vitamin D, Vitamin B, and multivitamin up until the day before surgery.)  No Alcohol for 24 hours before or after surgery.  No Smoking including e-cigarettes for 24 hours prior to surgery.  No chewable tobacco products for at least 6 hours prior to surgery.  No nicotine patches on the day of surgery.  Do not use any "recreational" drugs for at least a week prior to your surgery.   Please be advised that the combination of cocaine and anesthesia may have negative outcomes, up to and including death. If you test positive for cocaine, your surgery will be cancelled.  On the morning of surgery brush your teeth with toothpaste and water, you may rinse your mouth with mouthwash if you wish. Do not swallow any toothpaste or mouthwash.  Do not wear jewelry, make-up, hairpins, clips or nail polish.  Do not wear lotions, powders, or perfumes.   Do not shave body from the neck down 48 hours prior to surgery just in case you cut yourself which could leave a site for infection.  Also, freshly shaved skin may become irritated if using the CHG soap.  Contact lenses, hearing aids and dentures may not be worn into surgery.  Do not bring valuables to the hospital. [facility] is not responsible for any missing/lost belongings or valuables.   Notify your doctor if there is any change in your medical condition (cold, fever, infection).  Wear comfortable clothing (specific to your surgery type) to the hospital.  Plan for stool softeners for home use; pain medications have a tendency to cause constipation. You can also help prevent constipation by eating foods high in fiber such as fruits and vegetables and drinking plenty of fluids as your diet allows.  After surgery, you can help prevent lung complications by doing breathing exercises.  Take deep breaths and cough every 1-2 hours. Your doctor may order a device called an Incentive Spirometer to help  you take deep breaths. When coughing or sneezing, hold a pillow firmly against your incision with both hands. This is called "splinting." Doing this helps protect your incision. It also decreases belly discomfort.  If you are being admitted to the hospital overnight, leave your suitcase in the car. After surgery it may be brought to your room.  If you are being discharged the day of surgery, you will not be allowed to drive home. You  will need a responsible adult (18 years or older) to drive you home and stay with you that night.   If you are taking public transportation, you will need to have a responsible adult (18 years or older) with you. Please confirm with your physician that it is acceptable to use public transportation.   Please call the [facility] at [phone] if you have any questions about these instructions.  Visitation Policy:  Patients undergoing a surgery or procedure may have one family member or support person with them as long as that person is not COVID-19 positive or experiencing its symptoms.  That person may remain in the waiting area during the procedure.  Inpatient Visitation Update:   In an effort to ensure the safety of our team members and our patients, we are implementing a change to our visitation policy:  Effective Monday, Aug. 9, at 7 a.m., inpatients will be allowed one support person.  o The support person may change daily.  o The support person must pass our screening, gel in and out, and wear a mask at all times, including in the patient's room.  o Patients must also wear a mask when staff or their support person are in the room.  o Masking is required regardless of vaccination status.  Systemwide, no visitors 17 or younger.

## 2019-12-21 LAB — SARS CORONAVIRUS 2 (TAT 6-24 HRS): SARS Coronavirus 2: NEGATIVE

## 2019-12-24 ENCOUNTER — Ambulatory Visit: Payer: BC Managed Care – PPO | Admitting: Anesthesiology

## 2019-12-24 ENCOUNTER — Encounter: Payer: Self-pay | Admitting: General Surgery

## 2019-12-24 ENCOUNTER — Encounter
Admission: RE | Disposition: A | Discharge status: Home / Self Care | Payer: Self-pay | Source: Home / Self Care | Attending: General Surgery

## 2019-12-24 ENCOUNTER — Other Ambulatory Visit: Payer: Self-pay

## 2019-12-24 ENCOUNTER — Ambulatory Visit
Admission: RE | Admit: 2019-12-24 | Discharge: 2019-12-24 | Disposition: A | Discharge status: Home / Self Care | Payer: BC Managed Care – PPO | Attending: General Surgery | Admitting: General Surgery

## 2019-12-24 DIAGNOSIS — Z79899 Other long term (current) drug therapy: Secondary | ICD-10-CM | POA: Diagnosis not present

## 2019-12-24 DIAGNOSIS — K219 Gastro-esophageal reflux disease without esophagitis: Secondary | ICD-10-CM | POA: Diagnosis not present

## 2019-12-24 DIAGNOSIS — K8012 Calculus of gallbladder with acute and chronic cholecystitis without obstruction: Secondary | ICD-10-CM | POA: Diagnosis not present

## 2019-12-24 DIAGNOSIS — Z793 Long term (current) use of hormonal contraceptives: Secondary | ICD-10-CM | POA: Diagnosis not present

## 2019-12-24 DIAGNOSIS — F17210 Nicotine dependence, cigarettes, uncomplicated: Secondary | ICD-10-CM | POA: Diagnosis not present

## 2019-12-24 DIAGNOSIS — K8020 Calculus of gallbladder without cholecystitis without obstruction: Secondary | ICD-10-CM | POA: Diagnosis not present

## 2019-12-24 DIAGNOSIS — K8000 Calculus of gallbladder with acute cholecystitis without obstruction: Secondary | ICD-10-CM | POA: Diagnosis not present

## 2019-12-24 LAB — POCT PREGNANCY, URINE: Preg Test, Ur: NEGATIVE

## 2019-12-24 SURGERY — CHOLECYSTECTOMY, ROBOT-ASSISTED, LAPAROSCOPIC
Anesthesia: General | Site: Abdomen

## 2019-12-24 MED ORDER — FENTANYL CITRATE (PF) 100 MCG/2ML IJ SOLN
INTRAMUSCULAR | Status: AC
  Filled 2019-12-24: qty 2

## 2019-12-24 MED ORDER — INDOCYANINE GREEN 25 MG IV SOLR
1.2500 mg | Freq: Once | INTRAVENOUS | Status: AC
  Administered 2019-12-24: 13:00:00 1.25 mg via INTRAVENOUS
  Filled 2019-12-24: qty 0.5

## 2019-12-24 MED ORDER — FENTANYL CITRATE (PF) 100 MCG/2ML IJ SOLN
INTRAMUSCULAR | Status: AC
  Administered 2019-12-24: 16:00:00 50 ug via INTRAVENOUS
  Filled 2019-12-24: qty 2

## 2019-12-24 MED ORDER — DEXMEDETOMIDINE (PRECEDEX) IN NS 20 MCG/5ML (4 MCG/ML) IV SYRINGE
PREFILLED_SYRINGE | INTRAVENOUS | Status: AC
  Filled 2019-12-24: qty 5

## 2019-12-24 MED ORDER — CEFAZOLIN SODIUM-DEXTROSE 2-4 GM/100ML-% IV SOLN
2.0000 g | INTRAVENOUS | Status: AC
  Administered 2019-12-24: 14:00:00 2 g via INTRAVENOUS

## 2019-12-24 MED ORDER — LACTATED RINGERS IV SOLN: INTRAVENOUS | Status: DC

## 2019-12-24 MED ORDER — CHLORHEXIDINE GLUCONATE 0.12 % MT SOLN: 15.0000 mL | Freq: Once | OROMUCOSAL | Status: AC

## 2019-12-24 MED ORDER — OXYCODONE HCL 5 MG/5ML PO SOLN: 5.0000 mg | Freq: Once | ORAL | Status: AC | PRN

## 2019-12-24 MED ORDER — PROPOFOL 10 MG/ML IV BOLUS
INTRAVENOUS | Status: DC | PRN
  Administered 2019-12-24: 180 mg via INTRAVENOUS

## 2019-12-24 MED ORDER — BUPIVACAINE-EPINEPHRINE 0.25% -1:200000 IJ SOLN
INTRAMUSCULAR | Status: DC | PRN
  Administered 2019-12-24: 30 mL

## 2019-12-24 MED ORDER — KETOROLAC TROMETHAMINE 30 MG/ML IJ SOLN
INTRAMUSCULAR | Status: AC
  Filled 2019-12-24: qty 1

## 2019-12-24 MED ORDER — DEXAMETHASONE SODIUM PHOSPHATE 10 MG/ML IJ SOLN
INTRAMUSCULAR | Status: DC | PRN
  Administered 2019-12-24: 10 mg via INTRAVENOUS

## 2019-12-24 MED ORDER — LIDOCAINE HCL (CARDIAC) PF 100 MG/5ML IV SOSY
PREFILLED_SYRINGE | INTRAVENOUS | Status: DC | PRN
  Administered 2019-12-24: 100 mg via INTRAVENOUS

## 2019-12-24 MED ORDER — OXYCODONE HCL 5 MG PO TABS
ORAL_TABLET | ORAL | Status: AC
  Administered 2019-12-24: 16:00:00 5 mg via ORAL
  Filled 2019-12-24: qty 1

## 2019-12-24 MED ORDER — GLYCOPYRROLATE 0.2 MG/ML IJ SOLN
INTRAMUSCULAR | Status: AC
  Filled 2019-12-24: qty 1

## 2019-12-24 MED ORDER — OXYCODONE HCL 5 MG PO TABS: 5.0000 mg | ORAL_TABLET | Freq: Once | ORAL | Status: AC | PRN

## 2019-12-24 MED ORDER — LIDOCAINE HCL (PF) 2 % IJ SOLN
INTRAMUSCULAR | Status: AC
  Filled 2019-12-24: qty 5

## 2019-12-24 MED ORDER — ONDANSETRON HCL 4 MG/2ML IJ SOLN
INTRAMUSCULAR | Status: DC | PRN
  Administered 2019-12-24: 4 mg via INTRAVENOUS

## 2019-12-24 MED ORDER — HYDROMORPHONE HCL 1 MG/ML IJ SOLN
INTRAMUSCULAR | Status: AC
  Administered 2019-12-24: 17:00:00 0.5 mg via INTRAVENOUS
  Filled 2019-12-24: qty 1

## 2019-12-24 MED ORDER — KETOROLAC TROMETHAMINE 30 MG/ML IJ SOLN
30.0000 mg | Freq: Once | INTRAMUSCULAR | Status: AC
  Administered 2019-12-24: 18:00:00 30 mg via INTRAVENOUS

## 2019-12-24 MED ORDER — MIDAZOLAM HCL 2 MG/2ML IJ SOLN
INTRAMUSCULAR | Status: AC
  Filled 2019-12-24: qty 2

## 2019-12-24 MED ORDER — BUPIVACAINE-EPINEPHRINE (PF) 0.25% -1:200000 IJ SOLN
INTRAMUSCULAR | Status: AC
  Filled 2019-12-24: qty 30

## 2019-12-24 MED ORDER — ACETAMINOPHEN 10 MG/ML IV SOLN
INTRAVENOUS | Status: AC
  Filled 2019-12-24: qty 100

## 2019-12-24 MED ORDER — ROCURONIUM BROMIDE 10 MG/ML (PF) SYRINGE
PREFILLED_SYRINGE | INTRAVENOUS | Status: AC
  Filled 2019-12-24: qty 10

## 2019-12-24 MED ORDER — PROMETHAZINE HCL 25 MG/ML IJ SOLN
INTRAMUSCULAR | Status: AC
  Filled 2019-12-24: qty 1

## 2019-12-24 MED ORDER — PROPOFOL 10 MG/ML IV BOLUS
INTRAVENOUS | Status: AC
  Filled 2019-12-24: qty 20

## 2019-12-24 MED ORDER — HYDROCODONE-ACETAMINOPHEN 5-325 MG PO TABS: 1.0000 | ORAL_TABLET | ORAL | 0 refills | Status: AC | PRN

## 2019-12-24 MED ORDER — ONDANSETRON HCL 4 MG/2ML IJ SOLN
INTRAMUSCULAR | Status: AC
  Filled 2019-12-24: qty 2

## 2019-12-24 MED ORDER — FENTANYL CITRATE (PF) 100 MCG/2ML IJ SOLN
INTRAMUSCULAR | Status: DC | PRN
  Administered 2019-12-24: 50 ug via INTRAVENOUS
  Administered 2019-12-24: 25 ug via INTRAVENOUS
  Administered 2019-12-24: 100 ug via INTRAVENOUS
  Administered 2019-12-24: 25 ug via INTRAVENOUS

## 2019-12-24 MED ORDER — CEFAZOLIN SODIUM-DEXTROSE 2-4 GM/100ML-% IV SOLN
INTRAVENOUS | Status: AC
  Filled 2019-12-24: qty 100

## 2019-12-24 MED ORDER — CHLORHEXIDINE GLUCONATE 0.12 % MT SOLN
OROMUCOSAL | Status: AC
  Administered 2019-12-24: 13:00:00 15 mL via OROMUCOSAL
  Filled 2019-12-24: qty 15

## 2019-12-24 MED ORDER — HYDROMORPHONE HCL 1 MG/ML IJ SOLN
0.5000 mg | INTRAMUSCULAR | Status: DC | PRN
Start: 2019-12-24 — End: 2019-12-24
  Administered 2019-12-24: 0.5 mg via INTRAVENOUS

## 2019-12-24 MED ORDER — OXYCODONE HCL 5 MG PO TABS: 5.0000 mg | ORAL_TABLET | Freq: Once | ORAL | Status: AC

## 2019-12-24 MED ORDER — DEXMEDETOMIDINE (PRECEDEX) IN NS 20 MCG/5ML (4 MCG/ML) IV SYRINGE
PREFILLED_SYRINGE | INTRAVENOUS | Status: DC | PRN
  Administered 2019-12-24: 8 ug via INTRAVENOUS
  Administered 2019-12-24: 12 ug via INTRAVENOUS

## 2019-12-24 MED ORDER — MIDAZOLAM HCL 2 MG/2ML IJ SOLN
INTRAMUSCULAR | Status: DC | PRN
  Administered 2019-12-24: 2 mg via INTRAVENOUS

## 2019-12-24 MED ORDER — PROMETHAZINE HCL 25 MG/ML IJ SOLN: 6.2500 mg | INTRAMUSCULAR | Status: DC | PRN

## 2019-12-24 MED ORDER — SODIUM CHLORIDE FLUSH 0.9 % IV SOLN
INTRAVENOUS | Status: AC
  Filled 2019-12-24: qty 10

## 2019-12-24 MED ORDER — DEXAMETHASONE SODIUM PHOSPHATE 10 MG/ML IJ SOLN
INTRAMUSCULAR | Status: AC
  Filled 2019-12-24: qty 1

## 2019-12-24 MED ORDER — OXYCODONE HCL 5 MG PO TABS
ORAL_TABLET | ORAL | Status: AC
  Administered 2019-12-24: 18:00:00 5 mg via ORAL
  Filled 2019-12-24: qty 1

## 2019-12-24 MED ORDER — GLYCOPYRROLATE 0.2 MG/ML IJ SOLN
INTRAMUSCULAR | Status: DC | PRN
  Administered 2019-12-24: .2 mg via INTRAVENOUS

## 2019-12-24 MED ORDER — ACETAMINOPHEN 10 MG/ML IV SOLN
INTRAVENOUS | Status: DC | PRN
  Administered 2019-12-24: 1000 mg via INTRAVENOUS

## 2019-12-24 MED ORDER — FENTANYL CITRATE (PF) 100 MCG/2ML IJ SOLN
25.0000 ug | INTRAMUSCULAR | Status: DC | PRN
Start: 2019-12-24 — End: 2019-12-24
  Administered 2019-12-24 (×2): 50 ug via INTRAVENOUS
  Administered 2019-12-24 (×2): 25 ug via INTRAVENOUS

## 2019-12-24 MED ORDER — ROCURONIUM BROMIDE 100 MG/10ML IV SOLN
INTRAVENOUS | Status: DC | PRN
  Administered 2019-12-24: 50 mg via INTRAVENOUS
  Administered 2019-12-24: 20 mg via INTRAVENOUS

## 2019-12-24 MED ORDER — ORAL CARE MOUTH RINSE: 15.0000 mL | Freq: Once | OROMUCOSAL | Status: AC

## 2019-12-24 SURGICAL SUPPLY — 58 items
ADH SKN CLS APL DERMABOND .7 (GAUZE/BANDAGES/DRESSINGS) ×2
APL PRP STRL LF DISP 70% ISPRP (MISCELLANEOUS) ×2
BAG INFUSER PRESSURE 100CC (MISCELLANEOUS) IMPLANT
BAG SPEC RTRVL LRG 6X4 10 (ENDOMECHANICALS) ×2
BLADE SURG SZ11 CARB STEEL (BLADE) ×4 IMPLANT
CANISTER SUCT 1200ML W/VALVE (MISCELLANEOUS) ×4 IMPLANT
CANNULA REDUC XI 12-8 STAPL (CANNULA) ×1
CANNULA REDUC XI 12-8MM STAPL (CANNULA) ×1
CANNULA REDUCER 12-8 DVNC XI (CANNULA) ×2 IMPLANT
CHLORAPREP W/TINT 26 (MISCELLANEOUS) ×4 IMPLANT
CLIP VESOLOCK MED LG 6/CT (CLIP) ×4 IMPLANT
COVER WAND RF STERILE (DRAPES) ×4 IMPLANT
DECANTER SPIKE VIAL GLASS SM (MISCELLANEOUS) ×8 IMPLANT
DEFOGGER SCOPE WARMER CLEARIFY (MISCELLANEOUS) ×4 IMPLANT
DERMABOND ADVANCED (GAUZE/BANDAGES/DRESSINGS) ×2
DERMABOND ADVANCED .7 DNX12 (GAUZE/BANDAGES/DRESSINGS) ×2 IMPLANT
DRAPE ARM DVNC X/XI (DISPOSABLE) ×8 IMPLANT
DRAPE COLUMN DVNC XI (DISPOSABLE) ×2 IMPLANT
DRAPE DA VINCI XI ARM (DISPOSABLE) ×8
DRAPE DA VINCI XI COLUMN (DISPOSABLE) ×2
ELECT REM PT RETURN 9FT ADLT (ELECTROSURGICAL) ×4
ELECTRODE REM PT RTRN 9FT ADLT (ELECTROSURGICAL) ×2 IMPLANT
GLOVE BIO SURGEON STRL SZ 6.5 (GLOVE) ×18 IMPLANT
GLOVE BIO SURGEONS STRL SZ 6.5 (GLOVE) ×6
GLOVE BIOGEL PI IND STRL 6.5 (GLOVE) ×4 IMPLANT
GLOVE BIOGEL PI INDICATOR 6.5 (GLOVE) ×4
GOWN STRL REUS W/ TWL LRG LVL3 (GOWN DISPOSABLE) ×6 IMPLANT
GOWN STRL REUS W/TWL LRG LVL3 (GOWN DISPOSABLE) ×12
GRASPER SUT TROCAR 14GX15 (MISCELLANEOUS) IMPLANT
IRRIGATOR SUCT 8 DISP DVNC XI (IRRIGATION / IRRIGATOR) IMPLANT
IRRIGATOR SUCTION 8MM XI DISP (IRRIGATION / IRRIGATOR)
IV NS 1000ML (IV SOLUTION)
IV NS 1000ML BAXH (IV SOLUTION) IMPLANT
KIT PINK PAD W/HEAD ARE REST (MISCELLANEOUS) ×4
KIT PINK PAD W/HEAD ARM REST (MISCELLANEOUS) ×2 IMPLANT
LABEL OR SOLS (LABEL) ×4 IMPLANT
MANIFOLD NEPTUNE II (INSTRUMENTS) ×4 IMPLANT
NEEDLE HYPO 22GX1.5 SAFETY (NEEDLE) ×4 IMPLANT
NEEDLE INSUFFLATION 14GA 120MM (NEEDLE) ×4 IMPLANT
NS IRRIG 500ML POUR BTL (IV SOLUTION) ×4 IMPLANT
OBTURATOR OPTICAL STANDARD 8MM (TROCAR) ×2
OBTURATOR OPTICAL STND 8 DVNC (TROCAR) ×2
OBTURATOR OPTICALSTD 8 DVNC (TROCAR) ×2 IMPLANT
PACK LAP CHOLECYSTECTOMY (MISCELLANEOUS) ×4 IMPLANT
POUCH SPECIMEN RETRIEVAL 10MM (ENDOMECHANICALS) ×4 IMPLANT
SEAL CANN UNIV 5-8 DVNC XI (MISCELLANEOUS) ×6 IMPLANT
SEAL XI 5MM-8MM UNIVERSAL (MISCELLANEOUS) ×6
SET TUBE SMOKE EVAC HIGH FLOW (TUBING) ×4 IMPLANT
SOLUTION ELECTROLUBE (MISCELLANEOUS) ×4 IMPLANT
SPONGE LAP 4X18 RFD (DISPOSABLE) ×4 IMPLANT
STAPLER CANNULA SEAL DVNC XI (STAPLE) ×2 IMPLANT
STAPLER CANNULA SEAL XI (STAPLE) ×2
SUT MNCRL 4-0 (SUTURE) ×4
SUT MNCRL 4-0 27XMFL (SUTURE) ×2
SUT VIC AB 3-0 SH 27 (SUTURE) ×4
SUT VIC AB 3-0 SH 27X BRD (SUTURE) ×2 IMPLANT
SUT VICRYL 0 AB UR-6 (SUTURE) IMPLANT
SUTURE MNCRL 4-0 27XMF (SUTURE) ×2 IMPLANT

## 2019-12-24 NOTE — Anesthesia Preprocedure Evaluation (Signed)
Anesthesia Evaluation  Patient identified by MRN, date of birth, ID band Patient awake    Reviewed: Allergy & Precautions, H&P , NPO status , Patient's Chart, lab work & pertinent test results  History of Anesthesia Complications Negative for: history of anesthetic complications  Airway Mallampati: II  TM Distance: >3 FB Neck ROM: full    Dental  (+) Chipped   Pulmonary neg sleep apnea, neg COPD, Current Smoker and Patient abstained from smoking.,    Pulmonary exam normal        Cardiovascular Exercise Tolerance: Good (-) hypertension(-) Past MI and (-) CHF negative cardio ROS Normal cardiovascular exam(-) dysrhythmias (-) Valvular Problems/Murmurs     Neuro/Psych neg Seizures negative neurological ROS  negative psych ROS   GI/Hepatic Neg liver ROS, GERD  Medicated and Controlled,  Endo/Other  negative endocrine ROSneg diabetes  Renal/GU negative Renal ROS     Musculoskeletal   Abdominal   Peds  Hematology negative hematology ROS (+)   Anesthesia Other Findings Past Medical History: No date: GERD (gastroesophageal reflux disease) No date: Pneumonia  BMI    Body Mass Index: 38.37 kg/m      Reproductive/Obstetrics negative OB ROS                             Anesthesia Physical  Anesthesia Plan  ASA: II  Anesthesia Plan: General   Post-op Pain Management:    Induction: Intravenous  PONV Risk Score and Plan: 2 and Ondansetron and Dexamethasone  Airway Management Planned: Oral ETT  Additional Equipment:   Intra-op Plan:   Post-operative Plan: Extubation in OR  Informed Consent: I have reviewed the patients History and Physical, chart, labs and discussed the procedure including the risks, benefits and alternatives for the proposed anesthesia with the patient or authorized representative who has indicated his/her understanding and acceptance.     Dental Advisory  Given  Plan Discussed with: Anesthesiologist, CRNA and Surgeon  Anesthesia Plan Comments: (Patient consented for risks of anesthesia including but not limited to:  - adverse reactions to medications - damage to eyes, teeth, lips or other oral mucosa - nerve damage due to positioning  - sore throat or hoarseness - Damage to heart, brain, nerves, lungs, other parts of body or loss of life  Patient voiced understanding.)        Anesthesia Quick Evaluation

## 2019-12-24 NOTE — Op Note (Signed)
Preoperative diagnosis: Cholelithiasis  Postoperative diagnosis: Cholelithiasis Acute cholecytitis  Procedure: Robotic Assisted Laparoscopic Cholecystectomy.   Anesthesia: GETA   Surgeon: Dr. Windell Moment  Wound Classification: Clean Contaminated  Indications: Patient is a 29 y.o. female developed right upper quadrant pain was found to have cholelithiasis with a normal common duct. Robotic Assisted Laparoscopic cholecystectomy was elected.  Findings: Distended and tense gallbladder with clear bile Critical view of safety achieved Cystic duct and artery identified, ligated and divided Adequate hemostasis  Description of procedure: The patient was placed on the operating table in the supine position. General anesthesia was induced. A time-out was completed verifying correct patient, procedure, site, positioning, and implant(s) and/or special equipment prior to beginning this procedure. An orogastric tube was placed. The abdomen was prepped and draped in the usual sterile fashion.  An incision was made in a natural skin line below the umbilicus.  The fascia was elevated and the Veress needle inserted. Proper position was confirmed by aspiration and saline meniscus test.  The abdomen was insufflated with carbon dioxide to a pressure of 15 mmHg. The patient tolerated insufflation well. A 8-mm trocar was then inserted in optiview fashion.  The laparoscope was inserted and the abdomen inspected. No injuries from initial trocar placement were noted. Additional trocars were then inserted in the following locations: an 8-mm trocar in the left lateral abdomen, and another two 8-mm trocars to the right side of the abdomen 5 cm appart. The umbilical trocar was changed to a 12 mm trocar all under direct visualization. The abdomen was inspected and no abnormalities were found. The table was placed in the reverse Trendelenburg position with the right side up. The robotic arms were docked and target anatomy  identified. Instrument inserted under direct visualization.  Filmy adhesions between the gallbladder and omentum, duodenum and transverse colon were lysed with electrocautery. The dome of the gallbladder was grasped with a prograsp and retracted over the dome of the liver. The infundibulum was also grasped with an atraumatic grasper and retracted toward the right lower quadrant. This maneuver exposed Calot's triangle. The peritoneum overlying the gallbladder infundibulum was then incised and the cystic duct and cystic artery identified and circumferentially dissected. Critical view of safety reviewed before ligating any structure. Firefly images taken to visualize biliary ducts. The cystic duct and cystic artery were then doubly clipped and divided close to the gallbladder.  The gallbladder was then dissected from its peritoneal attachments by electrocautery. Hemostasis was checked and the gallbladder and contained stones were removed using an endoscopic retrieval bag. The gallbladder was passed off the table as a specimen. The gallbladder fossa was copiously irrigated with saline and hemostasis was obtained. There was no evidence of bleeding from the gallbladder fossa or cystic artery or leakage of the bile from the cystic duct stump. Secondary trocars were removed under direct vision. No bleeding was noted. The robotic arms were undoked. The scope was withdrawn and the umbilical trocar removed. The abdomen was allowed to collapse. The fascia of the 3mm trocar sites was closed with figure-of-eight 0 vicryl sutures. The skin was closed with subcuticular sutures of 4-0 monocryl and topical skin adhesive. The orogastric tube was removed.  The patient tolerated the procedure well and was taken to the postanesthesia care unit in stable condition.   Specimen: Gallbladder  Complications: None  EBL: 5 mL

## 2019-12-24 NOTE — Anesthesia Postprocedure Evaluation (Signed)
Anesthesia Post Note  Patient: Stephanie Price  Procedure(s) Performed: XI ROBOTIC ASSISTED LAPAROSCOPIC CHOLECYSTECTOMY (N/A Abdomen) INDOCYANINE GREEN FLUORESCENCE IMAGING (ICG)  Patient location during evaluation: PACU Anesthesia Type: General Level of consciousness: awake and alert Pain management: pain level controlled Vital Signs Assessment: post-procedure vital signs reviewed and stable Respiratory status: spontaneous breathing, nonlabored ventilation, respiratory function stable and patient connected to nasal cannula oxygen Cardiovascular status: blood pressure returned to baseline and stable Postop Assessment: no apparent nausea or vomiting Anesthetic complications: no   No complications documented.   Last Vitals:  Vitals:   12/24/19 1633 12/24/19 1642  BP: 122/78   Pulse: 71 91  Resp: 16 12  Temp:    SpO2: 99% 100%    Last Pain:  Vitals:   12/24/19 1642  TempSrc:   PainSc: 7                  Arita Miss

## 2019-12-24 NOTE — Transfer of Care (Signed)
Immediate Anesthesia Transfer of Care Note  Patient: Stephanie Price  Procedure(s) Performed: Procedure(s): XI ROBOTIC ASSISTED LAPAROSCOPIC CHOLECYSTECTOMY (N/A) INDOCYANINE GREEN FLUORESCENCE IMAGING (ICG)  Patient Location: PACU  Anesthesia Type:General  Level of Consciousness: sedated  Airway & Oxygen Therapy: Patient Spontanous Breathing and Patient connected to face mask oxygen  Post-op Assessment: Report given to RN and Post -op Vital signs reviewed and stable  Post vital signs: Reviewed and stable  Last Vitals:  Vitals:   12/24/19 1304 12/24/19 1548  BP: 135/85 (!) 148/90  Pulse: 77 97  Resp: 18 11  Temp: (!) 36.2 C (!) 36.4 C  SpO2: 166% 196%    Complications: No apparent anesthesia complications

## 2019-12-24 NOTE — Discharge Instructions (Addendum)
  Diet: Resume home heart healthy regular diet.   Activity: No heavy lifting >20 pounds (children, pets, laundry, garbage) or strenuous activity until follow-up, but light activity and walking are encouraged. Do not drive or drink alcohol if taking narcotic pain medications.  Wound care: May shower with soapy water and pat dry (do not rub incisions), but no baths or submerging incision underwater until follow-up. (no swimming)   Medications: Resume all home medications. For mild to moderate pain: acetaminophen (Tylenol) or ibuprofen (if no kidney disease). Combining Tylenol with alcohol can substantially increase your risk of causing liver disease. Narcotic pain medications, if prescribed, can be used for severe pain, though may cause nausea, constipation, and drowsiness. Do not combine Tylenol and Norco within a 6 hour period as Norco contains Tylenol. If you do not need the narcotic pain medication, you do not need to fill the prescription.  Call office ([phone]) at any time if any questions, worsening pain, fevers/chills, bleeding, drainage from incision site, or other concerns.   AMBULATORY SURGERY  DISCHARGE INSTRUCTIONS   1) The drugs that you were given will stay in your system until tomorrow so for the next 24 hours you should not:  A) Drive an automobile B) Make any legal decisions C) Drink any alcoholic beverage   2) You may resume regular meals tomorrow.  Today it is better to start with liquids and gradually work up to solid foods.  You may eat anything you prefer, but it is better to start with liquids, then soup and crackers, and gradually work up to solid foods.   3) Please notify your doctor immediately if you have any unusual bleeding, trouble breathing, redness and pain at the surgery site, drainage, fever, or pain not relieved by medication.    4) Additional Instructions:        Please contact your physician with any problems or Same Day Surgery at  [phone], Monday through Friday 6 am to 4 pm, or [facility] Main number at [phone]. 

## 2019-12-24 NOTE — Anesthesia Procedure Notes (Signed)
Procedure Name: Intubation Date/Time: 12/24/2019 1:56 PM Performed by: Doreen Salvage, CRNA Pre-anesthesia Checklist: Patient identified, Patient being monitored, Timeout performed, Emergency Drugs available and Suction available Patient Re-evaluated:Patient Re-evaluated prior to induction Oxygen Delivery Method: Circle system utilized Preoxygenation: Pre-oxygenation with 100% oxygen Induction Type: IV induction Ventilation: Mask ventilation without difficulty Laryngoscope Size: Mac, 3 and McGraph Grade View: Grade I Tube type: Oral Tube size: 7.0 mm Number of attempts: 1 Airway Equipment and Method: Stylet Placement Confirmation: ETT inserted through vocal cords under direct vision,  positive ETCO2 and breath sounds checked- equal and bilateral Secured at: 21 cm Tube secured with: Tape Dental Injury: Teeth and Oropharynx as per pre-operative assessment

## 2019-12-24 NOTE — Interval H&P Note (Signed)
History and Physical Interval Note:  12/24/2019 1:23 PM  Stephanie Price  has presented today for surgery, with the diagnosis of K80.20 Cholelithiasis w/o cholecystitis.  The various methods of treatment have been discussed with the patient and family. After consideration of risks, benefits and other options for treatment, the patient has consented to  Procedure(s): XI ROBOTIC [place] (N/A) as a surgical intervention.  The patient's history has been reviewed, patient examined, no change in status, stable for surgery.  I have reviewed the patient's chart and labs.  Questions were answered to the patient's satisfaction.     Herbert Pun

## 2019-12-26 LAB — SURGICAL PATHOLOGY

## 2020-01-20 DIAGNOSIS — M25461 Effusion, right knee: Secondary | ICD-10-CM | POA: Diagnosis not present

## 2020-01-28 DIAGNOSIS — M25561 Pain in right knee: Secondary | ICD-10-CM | POA: Diagnosis not present

## 2020-01-28 DIAGNOSIS — S83411A Sprain of medial collateral ligament of right knee, initial encounter: Secondary | ICD-10-CM | POA: Diagnosis not present

## 2020-01-29 DIAGNOSIS — M25561 Pain in right knee: Secondary | ICD-10-CM | POA: Diagnosis not present

## 2020-01-29 DIAGNOSIS — M25661 Stiffness of right knee, not elsewhere classified: Secondary | ICD-10-CM | POA: Diagnosis not present

## 2020-02-07 DIAGNOSIS — M25661 Stiffness of right knee, not elsewhere classified: Secondary | ICD-10-CM | POA: Diagnosis not present

## 2020-02-07 DIAGNOSIS — M25561 Pain in right knee: Secondary | ICD-10-CM | POA: Diagnosis not present

## 2020-02-11 DIAGNOSIS — M25561 Pain in right knee: Secondary | ICD-10-CM | POA: Diagnosis not present

## 2020-02-11 DIAGNOSIS — S83104D Unspecified dislocation of right knee, subsequent encounter: Secondary | ICD-10-CM | POA: Diagnosis not present

## 2020-02-21 DIAGNOSIS — M25561 Pain in right knee: Secondary | ICD-10-CM | POA: Diagnosis not present

## 2020-02-21 DIAGNOSIS — M1711 Unilateral primary osteoarthritis, right knee: Secondary | ICD-10-CM | POA: Diagnosis not present

## 2020-02-25 DIAGNOSIS — M659 Synovitis and tenosynovitis, unspecified: Secondary | ICD-10-CM | POA: Diagnosis not present

## 2020-05-08 DIAGNOSIS — D2272 Melanocytic nevi of left lower limb, including hip: Secondary | ICD-10-CM | POA: Diagnosis not present

## 2020-05-08 DIAGNOSIS — D2262 Melanocytic nevi of left upper limb, including shoulder: Secondary | ICD-10-CM | POA: Diagnosis not present

## 2020-05-08 DIAGNOSIS — D2261 Melanocytic nevi of right upper limb, including shoulder: Secondary | ICD-10-CM | POA: Diagnosis not present

## 2020-05-08 DIAGNOSIS — D225 Melanocytic nevi of trunk: Secondary | ICD-10-CM | POA: Diagnosis not present

## 2020-06-21 ENCOUNTER — Other Ambulatory Visit: Payer: Self-pay | Admitting: Obstetrics and Gynecology

## 2020-06-27 DIAGNOSIS — U071 COVID-19: Secondary | ICD-10-CM | POA: Diagnosis not present

## 2020-07-06 DIAGNOSIS — K219 Gastro-esophageal reflux disease without esophagitis: Secondary | ICD-10-CM | POA: Diagnosis not present

## 2020-07-06 DIAGNOSIS — L723 Sebaceous cyst: Secondary | ICD-10-CM | POA: Diagnosis not present

## 2020-07-06 DIAGNOSIS — R030 Elevated blood-pressure reading, without diagnosis of hypertension: Secondary | ICD-10-CM | POA: Diagnosis not present

## 2020-07-06 DIAGNOSIS — R202 Paresthesia of skin: Secondary | ICD-10-CM | POA: Diagnosis not present

## 2020-07-06 DIAGNOSIS — Z0001 Encounter for general adult medical examination with abnormal findings: Secondary | ICD-10-CM | POA: Diagnosis not present

## 2020-07-16 DIAGNOSIS — F411 Generalized anxiety disorder: Secondary | ICD-10-CM | POA: Diagnosis not present

## 2020-07-16 DIAGNOSIS — Z202 Contact with and (suspected) exposure to infections with a predominantly sexual mode of transmission: Secondary | ICD-10-CM | POA: Diagnosis not present

## 2020-07-16 DIAGNOSIS — F430 Acute stress reaction: Secondary | ICD-10-CM | POA: Diagnosis not present

## 2020-07-16 DIAGNOSIS — L723 Sebaceous cyst: Secondary | ICD-10-CM | POA: Diagnosis not present

## 2020-09-15 ENCOUNTER — Other Ambulatory Visit: Payer: Self-pay | Admitting: Obstetrics and Gynecology

## 2020-09-28 IMAGING — MR MR PELVIS WO/W CM
9 of 15 series · 26 of 48 positions shown · IV contrast (10ml Gadavist)
Comparison: None.

CLINICAL DATA: Complex cystic pelvic mass on outside ultrasound.

EXAM:
MRI PELVIS WITHOUT AND WITH CONTRAST
TECHNIQUE: Multiplanar multisequence MR imaging of the pelvis was performed
both before and after administration of intravenous contrast.
CONTRAST:  10mL GADAVIST GADOBUTROL 1 MMOL/ML IV SOLN

[Series 2: T2 · coronal · 5.0mm · 1.41mm/px · 1 of 30 slices shown (1 of 2)]
[im 1/30]
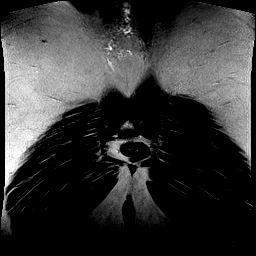

[Series 3: T2 · axial · 5.0mm · 0.75mm/px · 1 of 37 slices shown (2 of 2)]
[im 1/37]
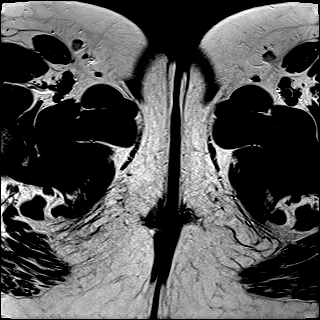

[Series 4: T2 fat-sat · axial · 5.0mm · 0.90mm/px · z∈[-134,+82]mm · 2 of 37 slices shown]
[im 1/37]
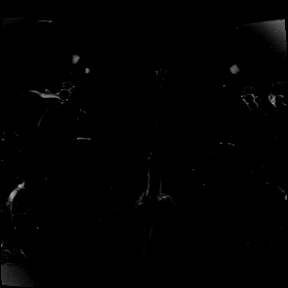
[im 37/37]
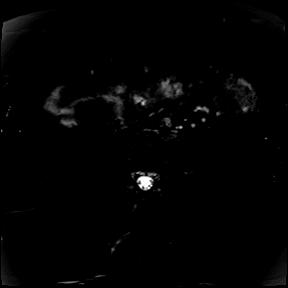

[Series 5: sag tse · sagittal · 5.0mm · 0.75mm/px · 2 of 33 slices shown]
[im 1/33]
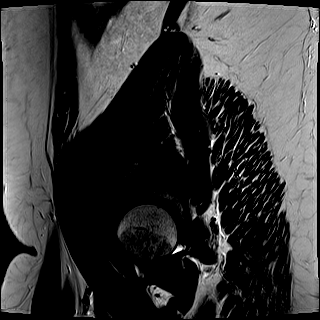
[im 33/33]
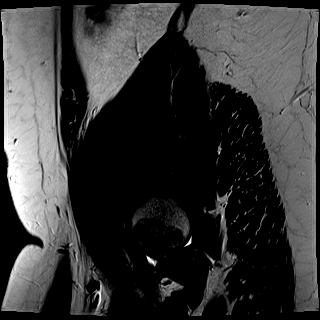

[Series 6: T1 dynamic fat-sat · axial · 3.0mm · 0.47mm/px · z∈[-138,+99]mm · 4 of 80 slices shown (1 of 4)]
[im 1/80]
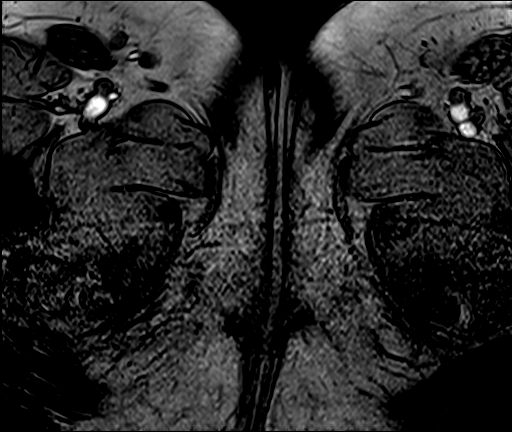
[im 27/80]
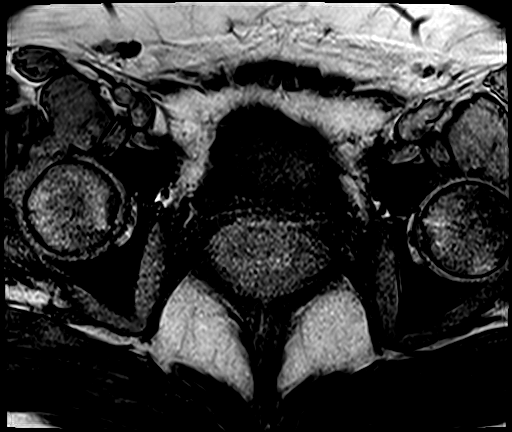
[im 53/80]
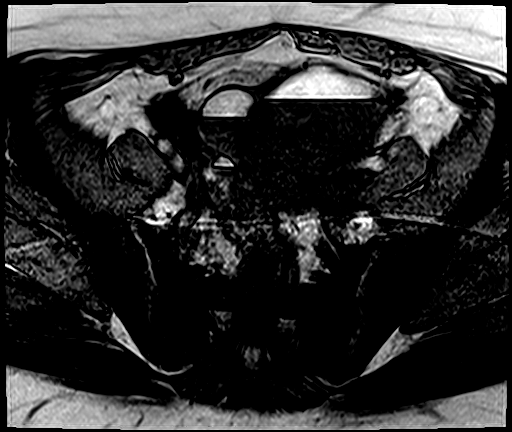
[im 80/80]
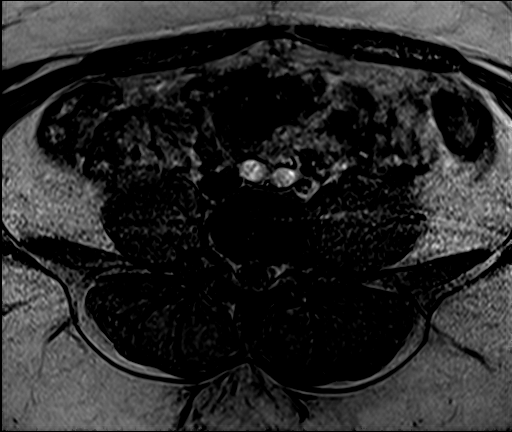

[Series 6: T1 dynamic fat-sat · axial · 3.0mm · 0.47mm/px · z∈[-138,+99]mm · 4 of 80 slices shown (2 of 4)]
[im 1/80]
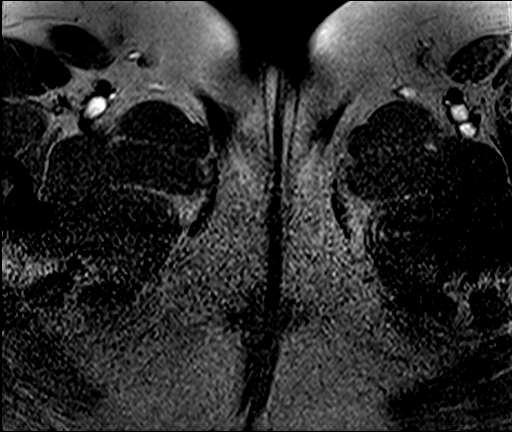
[im 27/80]
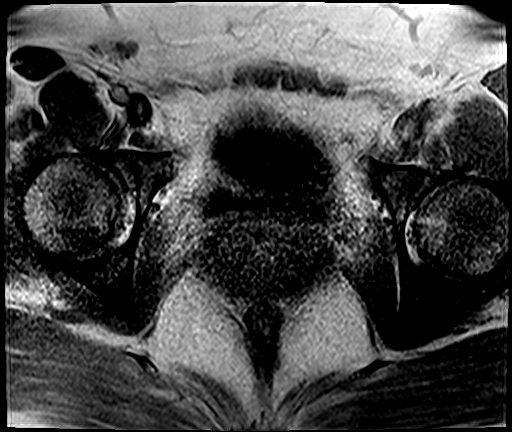
[im 53/80]
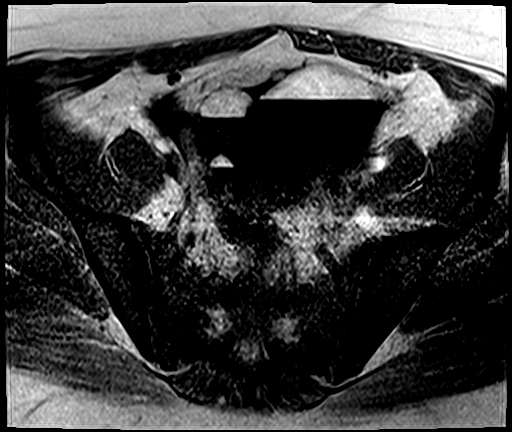
[im 80/80]
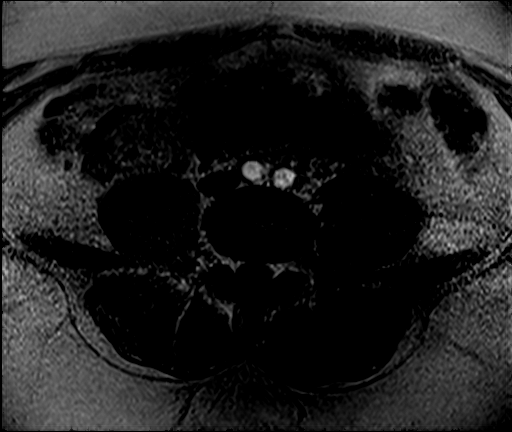

[Series 7: T1 dynamic fat-sat · axial · 3.0mm · 0.47mm/px · z∈[-138,+99]mm · 4 of 80 slices shown (3 of 4)]
[im 1/80]
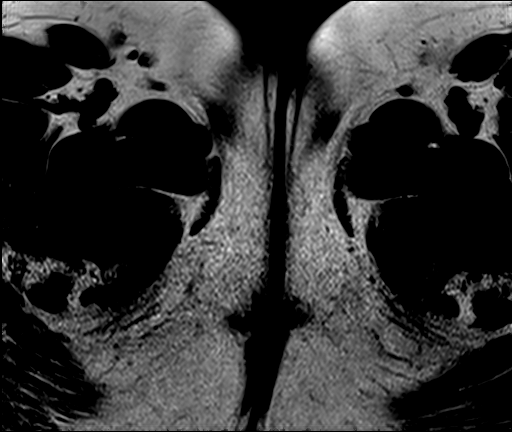
[im 27/80]
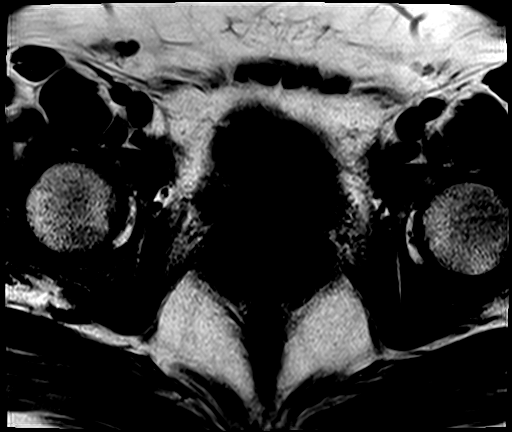
[im 53/80]
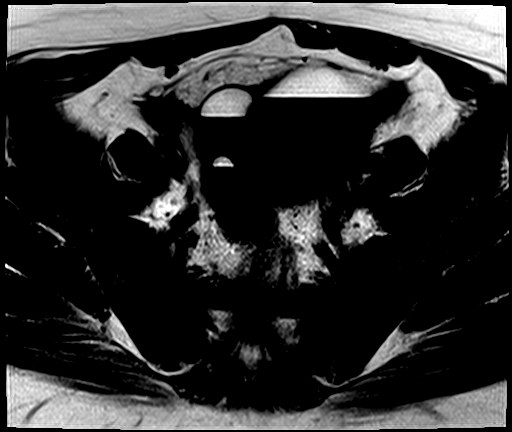
[im 80/80]
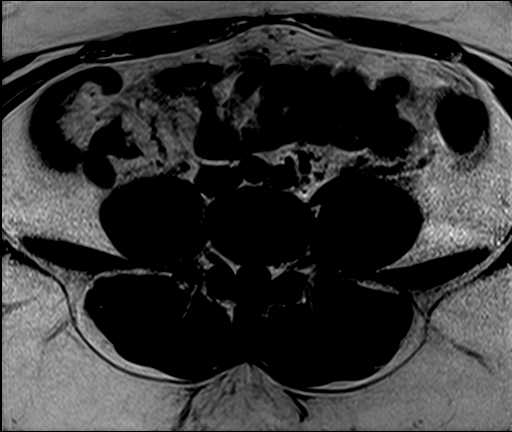

[Series 8: T1 dynamic fat-sat · axial · 3.0mm · 0.47mm/px · z∈[-138,+99]mm · 4 of 80 slices shown (4 of 4)]
[im 1/80]
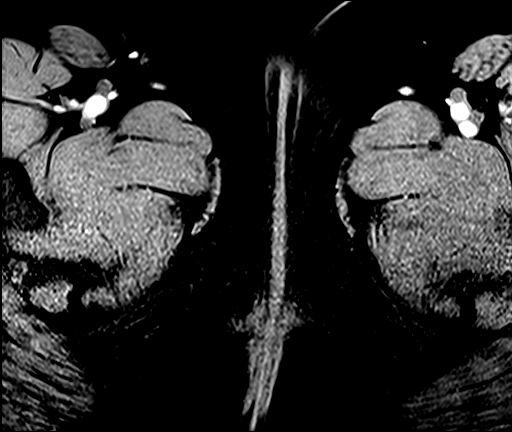
[im 27/80]
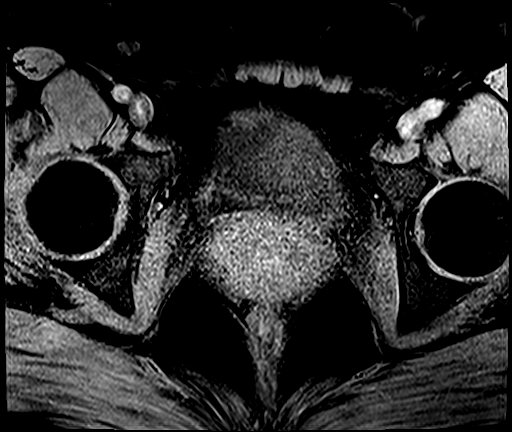
[im 53/80]
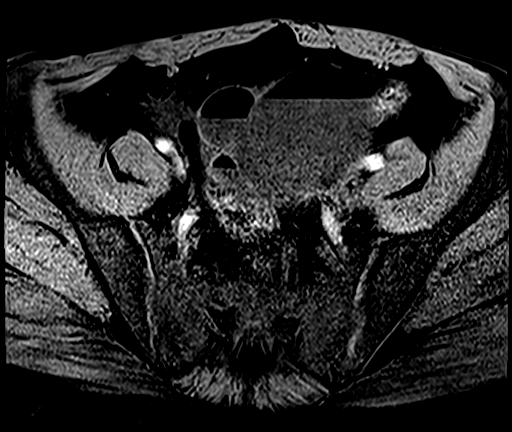
[im 80/80]
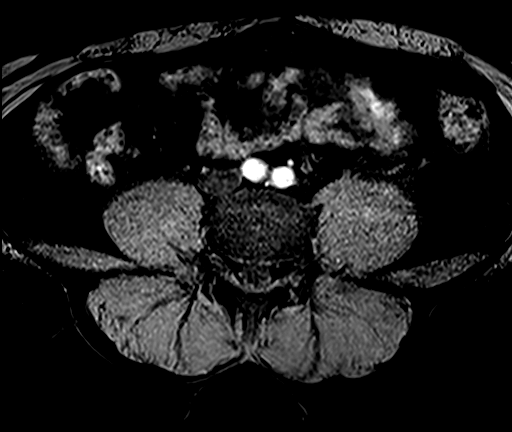

[Series 9: T1 dynamic · axial · 3.0mm · 0.47mm/px · z∈[-138,+99]mm · 4 of 80 slices shown]
[im 1/80]
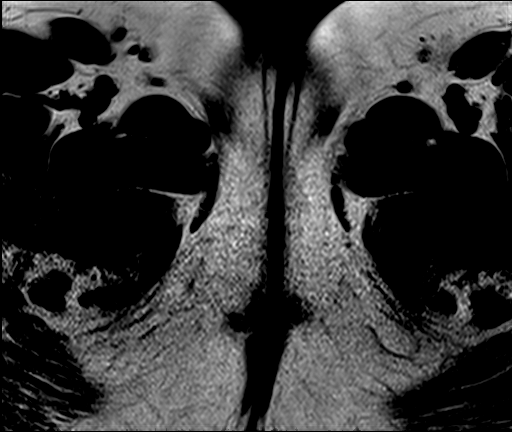
[im 27/80]
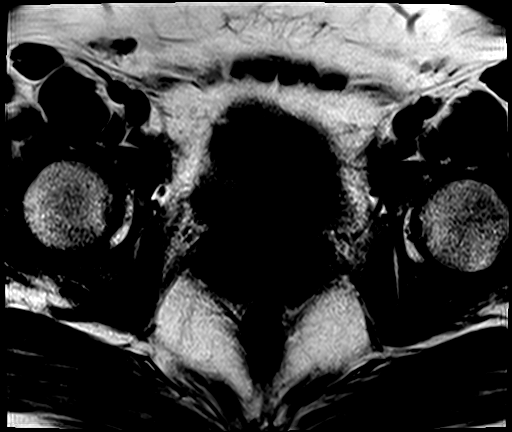
[im 53/80]
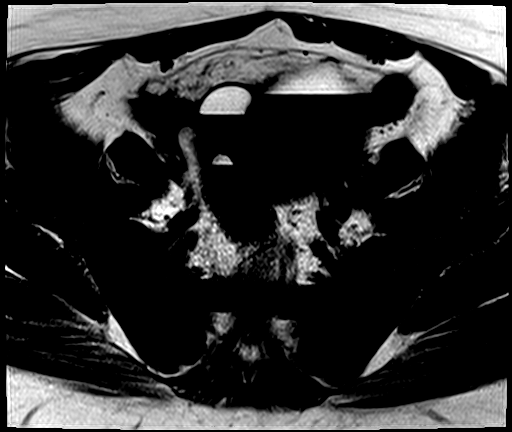
[im 80/80]
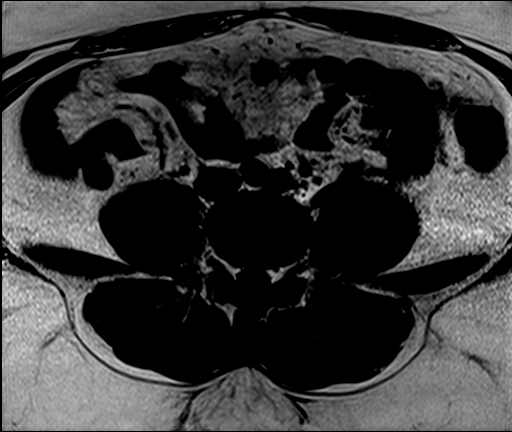

[26 of 48 positions shown; findings below may reference images not displayed]

FINDINGS: Lower Urinary Tract: Urinary bladder is nearly completely empty.
Unremarkable appearance of urethra.

Bowel: Unremarkable appearance of rectum and other pelvic bowel
loops.

Vascular/Lymphatic: Unremarkable. No pathologically enlarged pelvic
lymph nodes identified.

Reproductive:

-- Uterus: Normal size and appearance. No fibroids identified. No
abnormal endometrial thickening. Cervix and vagina are unremarkable.

-- Right ovary: A complex cystic lesion is seen in the anterior
pelvic midline which arises from the right adnexa. This measures
10.2 by 9.0 x 10.4 cm. This lesion shows cystic components with fat
fluid level as well as solid component containing fat, consistent
with a large ovarian dermoid.

-- Left ovary:  Normal appearance.

Other: Small amount of free fluid in right adnexa and pelvic
cul-de-sac.

Musculoskeletal:  Unremarkable.
IMPRESSION: 10.4 cm benign right ovarian dermoid.

Small amount of free pelvic fluid.

Normal appearance of left ovary and uterus.

## 2020-11-26 DIAGNOSIS — H9209 Otalgia, unspecified ear: Secondary | ICD-10-CM | POA: Diagnosis not present

## 2020-11-26 DIAGNOSIS — H60503 Unspecified acute noninfective otitis externa, bilateral: Secondary | ICD-10-CM | POA: Diagnosis not present

## 2021-02-08 DIAGNOSIS — J019 Acute sinusitis, unspecified: Secondary | ICD-10-CM | POA: Diagnosis not present

## 2021-06-23 DIAGNOSIS — J069 Acute upper respiratory infection, unspecified: Secondary | ICD-10-CM | POA: Diagnosis not present

## 2021-07-04 DIAGNOSIS — D235 Other benign neoplasm of skin of trunk: Secondary | ICD-10-CM | POA: Diagnosis not present

## 2021-07-04 DIAGNOSIS — N7689 Other specified inflammation of vagina and vulva: Secondary | ICD-10-CM | POA: Diagnosis not present

## 2021-07-19 DIAGNOSIS — N760 Acute vaginitis: Secondary | ICD-10-CM | POA: Diagnosis not present

## 2021-07-19 DIAGNOSIS — R030 Elevated blood-pressure reading, without diagnosis of hypertension: Secondary | ICD-10-CM | POA: Diagnosis not present

## 2021-07-19 DIAGNOSIS — L089 Local infection of the skin and subcutaneous tissue, unspecified: Secondary | ICD-10-CM | POA: Diagnosis not present

## 2021-07-19 DIAGNOSIS — F418 Other specified anxiety disorders: Secondary | ICD-10-CM | POA: Diagnosis not present

## 2021-07-28 DIAGNOSIS — Z131 Encounter for screening for diabetes mellitus: Secondary | ICD-10-CM | POA: Diagnosis not present

## 2021-07-28 DIAGNOSIS — Z13220 Encounter for screening for lipoid disorders: Secondary | ICD-10-CM | POA: Diagnosis not present

## 2021-07-28 DIAGNOSIS — Z0000 Encounter for general adult medical examination without abnormal findings: Secondary | ICD-10-CM | POA: Diagnosis not present

## 2021-07-28 DIAGNOSIS — K219 Gastro-esophageal reflux disease without esophagitis: Secondary | ICD-10-CM | POA: Diagnosis not present

## 2021-07-28 DIAGNOSIS — Z79899 Other long term (current) drug therapy: Secondary | ICD-10-CM | POA: Diagnosis not present

## 2021-10-26 DIAGNOSIS — U071 COVID-19: Secondary | ICD-10-CM | POA: Diagnosis not present

## 2021-10-26 DIAGNOSIS — J019 Acute sinusitis, unspecified: Secondary | ICD-10-CM | POA: Diagnosis not present

## 2021-10-26 DIAGNOSIS — J209 Acute bronchitis, unspecified: Secondary | ICD-10-CM | POA: Diagnosis not present

## 2021-11-04 DIAGNOSIS — N760 Acute vaginitis: Secondary | ICD-10-CM | POA: Diagnosis not present

## 2021-11-04 DIAGNOSIS — U099 Post covid-19 condition, unspecified: Secondary | ICD-10-CM | POA: Diagnosis not present

## 2021-11-04 DIAGNOSIS — R058 Other specified cough: Secondary | ICD-10-CM | POA: Diagnosis not present

## 2021-11-04 DIAGNOSIS — Z202 Contact with and (suspected) exposure to infections with a predominantly sexual mode of transmission: Secondary | ICD-10-CM | POA: Diagnosis not present

## 2022-01-18 DIAGNOSIS — N912 Amenorrhea, unspecified: Secondary | ICD-10-CM | POA: Diagnosis not present

## 2022-01-18 DIAGNOSIS — N926 Irregular menstruation, unspecified: Secondary | ICD-10-CM | POA: Diagnosis not present

## 2022-01-24 DIAGNOSIS — R61 Generalized hyperhidrosis: Secondary | ICD-10-CM | POA: Diagnosis not present

## 2022-01-24 DIAGNOSIS — Z114 Encounter for screening for human immunodeficiency virus [HIV]: Secondary | ICD-10-CM | POA: Diagnosis not present

## 2022-01-24 DIAGNOSIS — R590 Localized enlarged lymph nodes: Secondary | ICD-10-CM | POA: Diagnosis not present

## 2022-03-17 DIAGNOSIS — R32 Unspecified urinary incontinence: Secondary | ICD-10-CM | POA: Diagnosis not present

## 2022-03-17 DIAGNOSIS — I10 Essential (primary) hypertension: Secondary | ICD-10-CM | POA: Diagnosis not present

## 2022-03-17 DIAGNOSIS — N760 Acute vaginitis: Secondary | ICD-10-CM | POA: Diagnosis not present

## 2022-04-01 ENCOUNTER — Ambulatory Visit: Payer: BC Managed Care – PPO | Admitting: Obstetrics and Gynecology

## 2022-04-18 NOTE — Progress Notes (Unsigned)
New patient visit   Patient: Stephanie Price   DOB: August 22, 1990   32 y.o. Female  MRN: [id number] Visit Date: 04/19/2022  Today's healthcare provider: Jacky Kindle, FNP   No chief complaint on file.  Subjective    Stephanie Price is a 32 y.o. female who presents today as a new patient to establish care.  HPI  ***  Past Medical History:  Diagnosis Date   GERD (gastroesophageal reflux disease)    Pneumonia    Past Surgical History:  Procedure Laterality Date   LAPAROSCOPIC OVARIAN CYSTECTOMY N/A 03/21/2019   Procedure: LAPAROSCOPIC REMOVAL OF PELVIC MASS;  Surgeon: Vena Austria, MD;  Location: [facility] ORS;  Service: Gynecology;  Laterality: N/A;   TONSILLECTOMY     WISDOM TOOTH EXTRACTION     No family status information on file.   No family history on file. Social History   Socioeconomic History   Marital status: Single    Spouse name: Not on file   Number of children: 1   Years of education: Not on file   Highest education level: Not on file  Occupational History   Not on file  Tobacco Use   Smoking status: Every Day    Packs/day: .5    Types: Cigarettes   Smokeless tobacco: Never  Vaping Use   Vaping Use: Never used  Substance and Sexual Activity   Alcohol use: Yes    Comment: Occ   Drug use: Never   Sexual activity: Yes    Birth control/protection: Pill  Other Topics Concern   Not on file  Social History Narrative   Not on file   Social Determinants of Health   Financial Resource Strain: Not on file  Food Insecurity: Not on file  Transportation Needs: Not on file  Physical Activity: Not on file  Stress: Not on file  Social Connections: Not on file   Outpatient Medications Prior to Visit  Medication Sig   acyclovir (ZOVIRAX) 400 MG tablet Take 400 mg by mouth daily as needed (mouth sore).    Aspirin-Salicylamide-Caffeine (BC HEADACHE) 325-95-16 MG TABS Take 1 Package by mouth daily as needed (Headache).   bismuth subsalicylate (PEPTO BISMOL)  262 MG/15ML suspension Take 30 mLs by mouth every 6 (six) hours as needed for indigestion.   [name] 3-0.02 MG tablet TAKE 1 TABLET BY MOUTH EVERY DAY   ondansetron (ZOFRAN) 4 MG tablet Take 4 mg by mouth every 8 (eight) hours as needed.   PROTONIX 40 MG tablet TAKE 1 TABLET BY MOUTH TWICE A DAY FOR 2 WEEKS THEN TAKE 1 TABLET EVERY DAY FOR 6 MORE WEEKS (Patient taking differently: Take 40 mg by mouth daily.)   traMADol (ULTRAM) 50 MG tablet Take 50 mg by mouth at bedtime.   No facility-administered medications prior to visit.   No Known Allergies   There is no immunization history on file for this patient.  Health Maintenance  Topic Date Due   COVID-19 Vaccine (1) Never done   HIV Screening  Never done   Hepatitis C Screening  Never done   DTaP/Tdap/Td (1 - Tdap) Never done   PAP SMEAR-Modifier  10/09/2021   INFLUENZA VACCINE  08/11/2022   HPV VACCINES  Aged Out    Patient Care Team: [facility], [place] as PCP - General  Review of Systems  {Labs  Heme  Chem  Endocrine  Serology  Results Review (optional):23779}   Objective    There were no vitals taken for  this visit. {Show previous vital signs (optional):23777}  Physical Exam ***  Depression Screen     No data to display         No results found for any visits on 04/19/22.  Assessment & Plan     ***  No follow-ups on file.     {provider attestation***:1}   Jacky Kindle, FNP  [facility] [phone] (phone) [phone] (fax)  [facility]

## 2022-04-19 ENCOUNTER — Ambulatory Visit (INDEPENDENT_AMBULATORY_CARE_PROVIDER_SITE_OTHER): Payer: BC Managed Care – PPO | Admitting: Family Medicine

## 2022-04-19 ENCOUNTER — Encounter: Payer: Self-pay | Admitting: Family Medicine

## 2022-04-19 VITALS — BP 123/87 | HR 80 | Temp 98.1°F | Resp 17 | Ht 67.32 in | Wt 235.0 lb

## 2022-04-19 DIAGNOSIS — Z87891 Personal history of nicotine dependence: Secondary | ICD-10-CM

## 2022-04-19 DIAGNOSIS — E66812 Obesity, class 2: Secondary | ICD-10-CM

## 2022-04-19 DIAGNOSIS — E6609 Other obesity due to excess calories: Secondary | ICD-10-CM | POA: Insufficient documentation

## 2022-04-19 DIAGNOSIS — I10 Essential (primary) hypertension: Secondary | ICD-10-CM | POA: Diagnosis not present

## 2022-04-19 DIAGNOSIS — Z114 Encounter for screening for human immunodeficiency virus [HIV]: Secondary | ICD-10-CM

## 2022-04-19 DIAGNOSIS — Z1159 Encounter for screening for other viral diseases: Secondary | ICD-10-CM

## 2022-04-19 DIAGNOSIS — Z8249 Family history of ischemic heart disease and other diseases of the circulatory system: Secondary | ICD-10-CM | POA: Diagnosis not present

## 2022-04-19 DIAGNOSIS — K219 Gastro-esophageal reflux disease without esophagitis: Secondary | ICD-10-CM | POA: Diagnosis not present

## 2022-04-19 DIAGNOSIS — Z6835 Body mass index (BMI) 35.0-35.9, adult: Secondary | ICD-10-CM

## 2022-04-19 DIAGNOSIS — Z83438 Family history of other disorder of lipoprotein metabolism and other lipidemia: Secondary | ICD-10-CM

## 2022-04-19 NOTE — Assessment & Plan Note (Signed)
Has quit tobacco products; congratulated

## 2022-04-19 NOTE — Assessment & Plan Note (Signed)
Chronic, improved per pt report Body mass index is 36.45 kg/m. Discussed importance of healthy weight management Discussed diet and exercise

## 2022-04-19 NOTE — Assessment & Plan Note (Signed)
Chronic, stable Will check labs Reports family hx of HTN BP slightly elevated at 123/87 Endorses flushing and LE edema with small dose of norvasc at 2.5 mg Will recommend medication change to assist following labs

## 2022-04-19 NOTE — Assessment & Plan Note (Signed)
Low risk screen ?Consented; encouraged to "know your status" ?Recommend repeat screen if risk factors change ? ?

## 2022-04-19 NOTE — Assessment & Plan Note (Signed)
Low risk screen Treatable, and curable. If left untreated Hep C can lead to cirrhosis and liver failure. Encourage routine testing; recommend repeat testing if risk factors change.  

## 2022-04-19 NOTE — Assessment & Plan Note (Signed)
Chronic, stable Currently on PPI protonix 40 mg Encouraged to reduce to 20 mg 3x/week and monitor s/s; can reduce further. From there, we can change to prilosec 40/20 mg if symptoms remain and then decrease further vs start of pepcid daily/BID

## 2022-04-20 LAB — CBC WITH DIFFERENTIAL/PLATELET
Basophils Absolute: 0.1 10*3/uL (ref 0.0–0.2)
Basos: 1 %
EOS (ABSOLUTE): 0.1 10*3/uL (ref 0.0–0.4)
Eos: 1 %
Hematocrit: 44 % (ref 34.0–46.6)
Hemoglobin: 14.8 g/dL (ref 11.1–15.9)
Immature Grans (Abs): 0 10*3/uL (ref 0.0–0.1)
Immature Granulocytes: 0 %
Lymphocytes Absolute: 1.9 10*3/uL (ref 0.7–3.1)
Lymphs: 23 %
MCH: 30.5 pg (ref 26.6–33.0)
MCHC: 33.6 g/dL (ref 31.5–35.7)
MCV: 91 fL (ref 79–97)
Monocytes Absolute: 0.7 10*3/uL (ref 0.1–0.9)
Monocytes: 8 %
Neutrophils Absolute: 5.5 10*3/uL (ref 1.4–7.0)
Neutrophils: 67 %
Platelets: 253 10*3/uL (ref 150–450)
RBC: 4.85 x10E6/uL (ref 3.77–5.28)
RDW: 11.7 % (ref 11.7–15.4)
WBC: 8.3 10*3/uL (ref 3.4–10.8)

## 2022-04-20 LAB — HEPATITIS C ANTIBODY: Hep C Virus Ab: NONREACTIVE

## 2022-04-20 LAB — LIPID PANEL
Chol/HDL Ratio: 4 ratio (ref 0.0–4.4)
Cholesterol, Total: 173 mg/dL (ref 100–199)
HDL: 43 mg/dL (ref >39)
LDL Chol Calc (NIH): 102 mg/dL — ABNORMAL HIGH (ref 0–99)
Triglycerides: 157 mg/dL — ABNORMAL HIGH (ref 0–149)
VLDL Cholesterol Cal: 28 mg/dL (ref 5–40)

## 2022-04-20 LAB — COMPREHENSIVE METABOLIC PANEL
ALT: 19 IU/L (ref 0–32)
AST: 16 IU/L (ref 0–40)
Albumin/Globulin Ratio: 1.6 (ref 1.2–2.2)
Albumin: 4 g/dL (ref 3.9–4.9)
Alkaline Phosphatase: 61 IU/L (ref 44–121)
BUN/Creatinine Ratio: 11 (ref 9–23)
BUN: 10 mg/dL (ref 6–20)
Bilirubin Total: 0.5 mg/dL (ref 0.0–1.2)
CO2: 24 mmol/L (ref 20–29)
Calcium: 9.3 mg/dL (ref 8.7–10.2)
Chloride: 103 mmol/L (ref 96–106)
Creatinine, Ser: 0.89 mg/dL (ref 0.57–1.00)
Globulin, Total: 2.5 g/dL (ref 1.5–4.5)
Glucose: 85 mg/dL (ref 70–99)
Potassium: 4.5 mmol/L (ref 3.5–5.2)
Sodium: 139 mmol/L (ref 134–144)
Total Protein: 6.5 g/dL (ref 6.0–8.5)
eGFR: 88 mL/min/{1.73_m2} (ref >59)

## 2022-04-20 LAB — HEMOGLOBIN A1C
Est. average glucose Bld gHb Est-mCnc: 103 mg/dL
Hgb A1c MFr Bld: 5.2 % (ref 4.8–5.6)

## 2022-04-20 LAB — TSH+FREE T4
Free T4: 1.1 ng/dL (ref 0.82–1.77)
TSH: 1.5 u[IU]/mL (ref 0.450–4.500)

## 2022-04-20 LAB — HIV ANTIBODY (ROUTINE TESTING W REFLEX): HIV Screen 4th Generation wRfx: NONREACTIVE

## 2022-04-20 LAB — THYROID PEROXIDASE ANTIBODY: Thyroperoxidase Ab SerPl-aCnc: 9 IU/mL (ref 0–34)

## 2022-04-20 NOTE — Progress Notes (Signed)
It was nice to meet you yesterday, Stephanie Price. Results have been summarized below, please let us know if you have any questions - borderline elevation of LDL/bad cholesterol at 102. Continue lower fat diet and regular exercise to assist - All other labs are normal and stable, including concern for thyroid antibody which would indicate an auto-immune thyroid concern which may align with previous concern for PCOS. Normal A1c.

## 2022-05-26 ENCOUNTER — Ambulatory Visit: Payer: BC Managed Care – PPO | Admitting: Family Medicine

## 2022-05-26 ENCOUNTER — Encounter: Payer: Self-pay | Admitting: Family Medicine

## 2022-05-26 VITALS — BP 130/90 | HR 85 | Temp 98.0°F | Resp 16 | Wt 233.0 lb

## 2022-05-26 DIAGNOSIS — I10 Essential (primary) hypertension: Secondary | ICD-10-CM

## 2022-05-26 DIAGNOSIS — L02214 Cutaneous abscess of groin: Secondary | ICD-10-CM | POA: Insufficient documentation

## 2022-05-26 DIAGNOSIS — L03317 Cellulitis of buttock: Secondary | ICD-10-CM | POA: Insufficient documentation

## 2022-05-26 MED ORDER — DOXYCYCLINE HYCLATE 100 MG PO TABS: 100.0000 mg | ORAL_TABLET | Freq: Two times a day (BID) | ORAL | 0 refills | Status: DC

## 2022-05-26 NOTE — Patient Instructions (Signed)
Lume or Body Glide are two OTC options to assist with chafing

## 2022-05-26 NOTE — Assessment & Plan Note (Signed)
Acute, self limiting Has not healed with OTC tx x 1-2 weeks From exercise/chafing Follow up as needed; recommend doxy 100 BID x 7 days

## 2022-05-26 NOTE — Progress Notes (Signed)
Established patient visit   Patient: Stephanie Price   DOB: January 03, 1991   32 y.o. Female  MRN: [id number] Visit Date: 05/26/2022  Today's healthcare provider: Jacky Kindle, FNP  Re Introduced to nurse practitioner role and practice setting.  All questions answered.  Discussed provider/patient relationship and expectations.  Chief Complaint  Patient presents with   Skin Problem   Subjective    HPI Complains of having a bump on the right inner thigh x 2 weeks. States "seems to be a boil". Came after walking a lot.  Medications: Outpatient Medications Prior to Visit  Medication Sig   acyclovir (ZOVIRAX) 400 MG tablet Take 400 mg by mouth daily as needed (mouth sore).    amLODipine (NORVASC) 2.5 MG tablet Take 2.5 mg by mouth daily.   No facility-administered medications prior to visit.    Review of Systems    Objective    BP (!) 130/90 (BP Location: Right Leg, Cuff Size: Large)   Pulse 85   Temp 98 F (36.7 C) (Oral)   Resp 16   Wt 233 lb (105.7 kg)   SpO2 100% Comment: room air  BMI 36.14 kg/m   BP Readings from Last 3 Encounters:  05/26/22 (!) 130/90  04/19/22 123/87  12/24/19 130/89   Physical Exam Vitals and nursing note reviewed.  Constitutional:      General: She is not in acute distress.    Appearance: Normal appearance. She is obese. She is not ill-appearing, toxic-appearing or diaphoretic.  HENT:     Head: Normocephalic and atraumatic.  Cardiovascular:     Rate and Rhythm: Normal rate and regular rhythm.     Pulses: Normal pulses.     Heart sounds: Normal heart sounds. No murmur heard.    No friction rub. No gallop.  Pulmonary:     Effort: Pulmonary effort is normal. No respiratory distress.     Breath sounds: Normal breath sounds. No stridor. No wheezing, rhonchi or rales.  Chest:     Chest wall: No tenderness.  Genitourinary:   Musculoskeletal:        General: No swelling, tenderness, deformity or signs of injury. Normal range of  motion.     Right lower leg: No edema.     Left lower leg: No edema.  Skin:    General: Skin is warm and dry.     Capillary Refill: Capillary refill takes less than 2 seconds.     Coloration: Skin is not jaundiced or pale.     Findings: Erythema and lesion present. No bruising or rash.  Neurological:     General: No focal deficit present.     Mental Status: She is alert and oriented to person, place, and time. Mental status is at baseline.     Cranial Nerves: No cranial nerve deficit.     Sensory: No sensory deficit.     Motor: No weakness.     Coordination: Coordination normal.  Psychiatric:        Mood and Affect: Mood normal.        Behavior: Behavior normal.        Thought Content: Thought content normal.        Judgment: Judgment normal.     No results found for any visits on 05/26/22.  Assessment & Plan     Problem List Items Addressed This Visit       Cardiovascular and Mediastinum   Primary hypertension     Other  Abscess of right groin - Primary    Acute, self limiting Has not healed with OTC tx x 1-2 weeks From exercise/chafing Follow up as needed; recommend doxy 100 BID x 7 days      Relevant Medications   doxycycline (VIBRA-TABS) 100 MG tablet   Return if symptoms worsen or fail to improve.     Leilani Merl, FNP, have reviewed all documentation for this visit. The documentation on 05/26/22 for the exam, diagnosis, procedures, and orders are all accurate and complete.  Jacky Kindle, FNP  [facility] [phone] (phone) [phone] (fax)  [facility]

## 2022-05-30 ENCOUNTER — Ambulatory Visit: Payer: Self-pay | Admitting: Physician Assistant

## 2022-05-31 ENCOUNTER — Ambulatory Visit: Payer: Self-pay | Admitting: Family Medicine

## 2022-06-01 ENCOUNTER — Ambulatory Visit: Payer: BC Managed Care – PPO | Admitting: Family Medicine

## 2022-06-07 ENCOUNTER — Encounter: Payer: Self-pay | Admitting: Family Medicine

## 2022-06-09 ENCOUNTER — Ambulatory Visit: Payer: BC Managed Care – PPO | Admitting: Family Medicine

## 2022-08-09 DIAGNOSIS — H16101 Unspecified superficial keratitis, right eye: Secondary | ICD-10-CM | POA: Diagnosis not present

## 2022-10-06 ENCOUNTER — Ambulatory Visit (INDEPENDENT_AMBULATORY_CARE_PROVIDER_SITE_OTHER): Payer: BC Managed Care – PPO | Admitting: Family Medicine

## 2022-10-06 ENCOUNTER — Encounter: Payer: Self-pay | Admitting: Family Medicine

## 2022-10-06 VITALS — BP 120/79 | HR 95 | Ht 67.0 in | Wt 220.6 lb

## 2022-10-06 DIAGNOSIS — I10 Essential (primary) hypertension: Secondary | ICD-10-CM | POA: Diagnosis not present

## 2022-10-06 DIAGNOSIS — N949 Unspecified condition associated with female genital organs and menstrual cycle: Secondary | ICD-10-CM | POA: Insufficient documentation

## 2022-10-06 DIAGNOSIS — K2100 Gastro-esophageal reflux disease with esophagitis, without bleeding: Secondary | ICD-10-CM

## 2022-10-06 DIAGNOSIS — E6609 Other obesity due to excess calories: Secondary | ICD-10-CM | POA: Insufficient documentation

## 2022-10-06 DIAGNOSIS — Z6834 Body mass index (BMI) 34.0-34.9, adult: Secondary | ICD-10-CM

## 2022-10-06 MED ORDER — PANTOPRAZOLE SODIUM 20 MG PO TBEC: 20.0000 mg | DELAYED_RELEASE_TABLET | Freq: Two times a day (BID) | ORAL | 3 refills | Status: AC

## 2022-10-06 MED ORDER — AMLODIPINE BESYLATE 2.5 MG PO TABS: 2.5000 mg | ORAL_TABLET | Freq: Every day | ORAL | 3 refills | Status: AC

## 2022-10-06 NOTE — Assessment & Plan Note (Signed)
Chronic, improved Continue low dose norvasc to assist at 2.5 mg daily

## 2022-10-06 NOTE — Progress Notes (Signed)
Established patient visit   Patient: Stephanie Price   DOB: 1990-11-16   32 y.o. Female  MRN: [id number] Visit Date: 10/06/2022  Today's healthcare provider: Jacky Kindle, FNP  Introduced to nurse practitioner role and practice setting.  All questions answered.  Discussed provider/patient relationship and expectations.  Subjective    HPI HPI     Medical Management of Chronic Issues    Additional comments: 3 month hypertension follow up. Patient reports low readying at bariatric appointment and wanted to check in. Also reports hands and feet have been freezing.         Comments   Patient also present due to possible tampon being left in. Reports can't remember if she took out previously or not.      Last edited by Acey Lav, CMA on 10/06/2022  2:11 PM.        Medications: Outpatient Medications Prior to Visit  Medication Sig   acyclovir (ZOVIRAX) 400 MG tablet Take 400 mg by mouth daily as needed (mouth sore).    [DISCONTINUED] amLODipine (NORVASC) 2.5 MG tablet Take 2.5 mg by mouth daily.   [DISCONTINUED] pantoprazole (PROTONIX) 40 MG tablet Take 40 mg by mouth daily.   [DISCONTINUED] doxycycline (VIBRA-TABS) 100 MG tablet Take 1 tablet (100 mg total) by mouth 2 (two) times daily. (Patient not taking: Reported on 10/06/2022)   No facility-administered medications prior to visit.    Review of Systems      Objective    BP 120/79 (BP Location: Right Arm, Patient Position: Sitting, Cuff Size: Normal)   Pulse 95   Ht 5\' 7"  (1.702 m)   Wt 220 lb 9.6 oz (100.1 kg)   SpO2 99%   BMI 34.55 kg/m    Physical Exam Vitals and nursing note reviewed. Exam conducted with a chaperone present.  Constitutional:      General: She is not in acute distress.    Appearance: Normal appearance. She is obese. She is not ill-appearing, toxic-appearing or diaphoretic.  HENT:     Head: Normocephalic and atraumatic.  Cardiovascular:     Rate and Rhythm: Normal rate and regular  rhythm.     Pulses: Normal pulses.     Heart sounds: Normal heart sounds. No murmur heard.    No friction rub. No gallop.  Pulmonary:     Effort: Pulmonary effort is normal. No respiratory distress.     Breath sounds: Normal breath sounds. No stridor. No wheezing, rhonchi or rales.  Chest:     Chest wall: No tenderness.  Abdominal:     General: Bowel sounds are normal.     Palpations: Abdomen is soft.  Genitourinary:    General: Normal vulva.     Exam position: Lithotomy position.     Tanner stage (genital): 5.     Vagina: Normal.     Cervix: Normal.     Uterus: Normal.      Adnexa: Right adnexa normal and left adnexa normal.     Comments: No retained tampons on speculum or manual exam Musculoskeletal:        General: No swelling, tenderness, deformity or signs of injury. Normal range of motion.     Right lower leg: No edema.     Left lower leg: No edema.  Skin:    General: Skin is warm and dry.     Capillary Refill: Capillary refill takes less than 2 seconds.     Coloration: Skin is not jaundiced or pale.  Findings: No bruising, erythema, lesion or rash.  Neurological:     General: No focal deficit present.     Mental Status: She is alert and oriented to person, place, and time. Mental status is at baseline.     Cranial Nerves: No cranial nerve deficit.     Sensory: No sensory deficit.     Motor: No weakness.     Coordination: Coordination normal.  Psychiatric:        Mood and Affect: Mood normal.        Behavior: Behavior normal.        Thought Content: Thought content normal.        Judgment: Judgment normal.     No results found for any visits on 10/06/22.  Assessment & Plan     Problem List Items Addressed This Visit       Cardiovascular and Mediastinum   Primary hypertension - Primary    Chronic, improved Continue low dose norvasc to assist at 2.5 mg daily       Relevant Medications   amLODipine (NORVASC) 2.5 MG tablet     Digestive    Gastroesophageal reflux disease without esophagitis    Chronic, stable Reports flares intermittently; will switch from 40 mg daily to 20 mg BID dosing to assist       Relevant Medications   pantoprazole (PROTONIX) 20 MG tablet     Other   Class 1 obesity due to excess calories with serious comorbidity and body mass index (BMI) of 34.0 to 34.9 in adult    Chronic, improved Advised that consistent 1200-1500 kCal may be too low and result in non sustained weight loss Continue to recommend balanced, lower carb meals. Smaller meal size, adding snacks. Choosing water as drink of choice and increasing purposeful exercise.       Vaginal discomfort    Concern for retained tampon; no foreign bodies noted on exam       Return if symptoms worsen or fail to improve.      Leilani Merl, FNP, have reviewed all documentation for this visit. The documentation on 10/06/22 for the exam, diagnosis, procedures, and orders are all accurate and complete.  Jacky Kindle, FNP  [facility] [phone] (phone) [phone] (fax)  [facility]

## 2022-10-06 NOTE — Assessment & Plan Note (Signed)
Chronic, improved Advised that consistent 1200-1500 kCal may be too low and result in non sustained weight loss Continue to recommend balanced, lower carb meals. Smaller meal size, adding snacks. Choosing water as drink of choice and increasing purposeful exercise.

## 2022-10-06 NOTE — Assessment & Plan Note (Signed)
Chronic, stable Reports flares intermittently; will switch from 40 mg daily to 20 mg BID dosing to assist

## 2022-10-06 NOTE — Assessment & Plan Note (Signed)
Concern for retained tampon; no foreign bodies noted on exam

## 2022-10-18 ENCOUNTER — Ambulatory Visit (INDEPENDENT_AMBULATORY_CARE_PROVIDER_SITE_OTHER): Payer: BC Managed Care – PPO | Admitting: Family Medicine

## 2022-10-18 DIAGNOSIS — Z91199 Patient's noncompliance with other medical treatment and regimen due to unspecified reason: Secondary | ICD-10-CM

## 2022-10-18 NOTE — Progress Notes (Signed)
Patient was not seen for appt d/t no call, no show, or late arrival >10 mins past appt time.   Elise T Payne, FNP  [facility] [address] [place], [place] [address] [phone] (phone) [phone] (fax) [facility]  

## 2022-12-04 DIAGNOSIS — J028 Acute pharyngitis due to other specified organisms: Secondary | ICD-10-CM | POA: Diagnosis not present

## 2022-12-04 DIAGNOSIS — R062 Wheezing: Secondary | ICD-10-CM | POA: Diagnosis not present

## 2022-12-04 DIAGNOSIS — H6503 Acute serous otitis media, bilateral: Secondary | ICD-10-CM | POA: Diagnosis not present

## 2022-12-04 DIAGNOSIS — J208 Acute bronchitis due to other specified organisms: Secondary | ICD-10-CM | POA: Diagnosis not present

## 2022-12-04 DIAGNOSIS — H9203 Otalgia, bilateral: Secondary | ICD-10-CM | POA: Diagnosis not present

## 2022-12-04 DIAGNOSIS — R051 Acute cough: Secondary | ICD-10-CM | POA: Diagnosis not present

## 2023-02-07 DIAGNOSIS — D485 Neoplasm of uncertain behavior of skin: Secondary | ICD-10-CM | POA: Diagnosis not present

## 2023-02-07 DIAGNOSIS — Z1389 Encounter for screening for other disorder: Secondary | ICD-10-CM | POA: Diagnosis not present

## 2023-02-07 DIAGNOSIS — I10 Essential (primary) hypertension: Secondary | ICD-10-CM | POA: Diagnosis not present

## 2023-02-07 DIAGNOSIS — K219 Gastro-esophageal reflux disease without esophagitis: Secondary | ICD-10-CM | POA: Diagnosis not present

## 2023-02-07 DIAGNOSIS — Z791 Long term (current) use of non-steroidal anti-inflammatories (NSAID): Secondary | ICD-10-CM | POA: Diagnosis not present

## 2023-02-07 DIAGNOSIS — Z79899 Other long term (current) drug therapy: Secondary | ICD-10-CM | POA: Diagnosis not present

## 2023-02-07 DIAGNOSIS — Z0001 Encounter for general adult medical examination with abnormal findings: Secondary | ICD-10-CM | POA: Diagnosis not present

## 2023-02-07 DIAGNOSIS — Z13220 Encounter for screening for lipoid disorders: Secondary | ICD-10-CM | POA: Diagnosis not present

## 2023-06-20 DIAGNOSIS — R000 Tachycardia, unspecified: Secondary | ICD-10-CM | POA: Diagnosis not present

## 2023-06-20 DIAGNOSIS — I10 Essential (primary) hypertension: Secondary | ICD-10-CM | POA: Diagnosis not present

## 2023-06-20 DIAGNOSIS — R9431 Abnormal electrocardiogram [ECG] [EKG]: Secondary | ICD-10-CM | POA: Diagnosis not present

## 2023-08-01 DIAGNOSIS — R000 Tachycardia, unspecified: Secondary | ICD-10-CM | POA: Diagnosis not present

## 2023-09-05 DIAGNOSIS — L980 Pyogenic granuloma: Secondary | ICD-10-CM | POA: Diagnosis not present

## 2023-09-12 DIAGNOSIS — L859 Epidermal thickening, unspecified: Secondary | ICD-10-CM | POA: Diagnosis not present

## 2023-09-12 DIAGNOSIS — L980 Pyogenic granuloma: Secondary | ICD-10-CM | POA: Diagnosis not present

## 2023-09-12 DIAGNOSIS — D485 Neoplasm of uncertain behavior of skin: Secondary | ICD-10-CM | POA: Diagnosis not present
